# Patient Record
Sex: Female | Born: 1995 | Race: Black or African American | Hispanic: No | Marital: Single | Smoking: Never smoker
Health system: Southern US, Community
[De-identification: ages and names within clinical notes are randomized; demographics above are authoritative.]

## PROBLEM LIST (undated history)

## (undated) DIAGNOSIS — B009 Herpesviral infection, unspecified: Secondary | ICD-10-CM

## (undated) HISTORY — DX: Herpesviral infection, unspecified: B00.9

---
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
## 2015-08-26 ENCOUNTER — Encounter: Payer: Self-pay | Admitting: Obstetrics

## 2015-08-26 ENCOUNTER — Ambulatory Visit (INDEPENDENT_AMBULATORY_CARE_PROVIDER_SITE_OTHER): Payer: 59 | Admitting: Obstetrics

## 2015-08-26 VITALS — BP 113/69 | HR 73 | Temp 98.7°F | Wt 140.0 lb

## 2015-08-26 DIAGNOSIS — B373 Candidiasis of vulva and vagina: Secondary | ICD-10-CM

## 2015-08-26 DIAGNOSIS — N760 Acute vaginitis: Secondary | ICD-10-CM | POA: Diagnosis not present

## 2015-08-26 DIAGNOSIS — N898 Other specified noninflammatory disorders of vagina: Secondary | ICD-10-CM | POA: Diagnosis not present

## 2015-08-26 DIAGNOSIS — A499 Bacterial infection, unspecified: Secondary | ICD-10-CM

## 2015-08-26 DIAGNOSIS — B9689 Other specified bacterial agents as the cause of diseases classified elsewhere: Secondary | ICD-10-CM

## 2015-08-26 DIAGNOSIS — B3731 Acute candidiasis of vulva and vagina: Secondary | ICD-10-CM

## 2015-08-26 MED ORDER — FLUCONAZOLE 150 MG PO TABS: 150.0000 mg | ORAL_TABLET | Freq: Once | ORAL | Status: DC

## 2015-08-26 MED ORDER — METRONIDAZOLE 0.75 % VA GEL: 1.0000 | Freq: Two times a day (BID) | VAGINAL | Status: DC

## 2015-08-26 NOTE — Progress Notes (Signed)
Patient ID: Cassie PennerLauren M Church, female   DOB: 11-Apr-1996, 19 y.o.   MRN: [id number]  Chief Complaint  Patient presents with  . Vaginal Discharge    HPI Cassie Church is a 19 y.o. female.  Malodorous vaginal discharge - fishy odor.  Denies itching. HPI  History reviewed. No pertinent past medical history.  Past Surgical History  Procedure Laterality Date  . Wisdom tooth extraction Bilateral 2014    Family History  Problem Relation Age of Onset  . Hyperlipidemia Father   . Hypertension Father   . Diabetes Paternal Grandfather     Social History Social History  Substance Use Topics  . Smoking status: Never Smoker   . Smokeless tobacco: None  . Alcohol Use: No    No Known Allergies  Current Outpatient Prescriptions  Medication Sig Dispense Refill  . fluconazole (DIFLUCAN) 150 MG tablet Take 1 tablet (150 mg total) by mouth once. 1 tablet 2  . metroNIDAZOLE (METROGEL VAGINAL) 0.75 % vaginal gel Place 1 Applicatorful vaginally 2 (two) times daily. 70 g 2   No current facility-administered medications for this visit.    Review of Systems Review of Systems Constitutional: negative for fatigue and weight loss Respiratory: negative for cough and wheezing Cardiovascular: negative for chest pain, fatigue and palpitations Gastrointestinal: negative for abdominal pain and change in bowel habits Genitourinary: positive for malodorous vaginal discharge Integument/breast: negative for nipple discharge Musculoskeletal:negative for myalgias Neurological: negative for gait problems and tremors Behavioral/Psych: negative for abusive relationship, depression Endocrine: negative for temperature intolerance     Blood pressure 113/69, pulse 73, temperature 98.7 F (37.1 C), weight 140 lb (63.504 kg), last menstrual period 07/19/2015.  Physical Exam Physical Exam            General:  Alert and no distress Abdomen:  normal findings: no organomegaly, soft, non-tender and no hernia   Pelvis:  External genitalia: normal general appearance Urinary system: urethral meatus normal and bladder without fullness, nontender Vaginal: normal without tenderness, induration or masses Cervix: normal appearance Adnexa: normal bimanual exam Uterus: anteverted and non-tender, normal size      Data Reviewed Labs  Assessment     BV     Plan    MetroGel Rx F/U prn  Orders Placed This Encounter  Procedures  . SureSwab, Vaginosis/Vaginitis Plus   Meds ordered this encounter  Medications  . fluconazole (DIFLUCAN) 150 MG tablet    Sig: Take 1 tablet (150 mg total) by mouth once.    Dispense:  1 tablet    Refill:  2  . metroNIDAZOLE (METROGEL VAGINAL) 0.75 % vaginal gel    Sig: Place 1 Applicatorful vaginally 2 (two) times daily.    Dispense:  70 g    Refill:  2

## 2015-08-30 ENCOUNTER — Other Ambulatory Visit: Payer: Self-pay | Admitting: Obstetrics

## 2015-08-30 DIAGNOSIS — A5609 Other chlamydial infection of lower genitourinary tract: Secondary | ICD-10-CM

## 2015-08-30 LAB — SURESWAB, VAGINOSIS/VAGINITIS PLUS
Atopobium vaginae: NOT DETECTED Log (cells/mL)
C. GLABRATA, DNA: NOT DETECTED
C. TRACHOMATIS RNA, TMA: DETECTED — AB
C. TROPICALIS, DNA: NOT DETECTED
C. albicans, DNA: NOT DETECTED
C. parapsilosis, DNA: NOT DETECTED
LACTOBACILLUS SPECIES: 8 Log (cells/mL)
MEGASPHAERA SPECIES: NOT DETECTED Log (cells/mL)
N. gonorrhoeae RNA, TMA: NOT DETECTED
T. vaginalis RNA, QL TMA: NOT DETECTED

## 2015-08-30 MED ORDER — CEFIXIME 400 MG PO TABS: 400.0000 mg | ORAL_TABLET | Freq: Every day | ORAL | Status: DC

## 2015-08-30 MED ORDER — AZITHROMYCIN 250 MG PO TABS: 1000.0000 mg | ORAL_TABLET | Freq: Once | ORAL | Status: DC

## 2015-11-03 ENCOUNTER — Ambulatory Visit: Payer: Self-pay | Admitting: Obstetrics

## 2016-09-14 ENCOUNTER — Ambulatory Visit (INDEPENDENT_AMBULATORY_CARE_PROVIDER_SITE_OTHER): Payer: 59 | Admitting: Obstetrics

## 2016-09-14 ENCOUNTER — Encounter: Payer: Self-pay | Admitting: Obstetrics

## 2016-09-14 VITALS — BP 119/67 | HR 74 | Wt 138.0 lb

## 2016-09-14 DIAGNOSIS — N760 Acute vaginitis: Secondary | ICD-10-CM | POA: Diagnosis not present

## 2016-09-14 DIAGNOSIS — N898 Other specified noninflammatory disorders of vagina: Secondary | ICD-10-CM | POA: Diagnosis not present

## 2016-09-14 DIAGNOSIS — B9689 Other specified bacterial agents as the cause of diseases classified elsewhere: Secondary | ICD-10-CM

## 2016-09-14 MED ORDER — METRONIDAZOLE 0.75 % VA GEL: 1.0000 | Freq: Two times a day (BID) | VAGINAL | 2 refills | Status: DC

## 2016-09-14 NOTE — Addendum Note (Signed)
Addended by: Elby BeckPAUL, JANE F on: 09/14/2016 02:09 PM   Modules accepted: Orders

## 2016-09-14 NOTE — Progress Notes (Signed)
Patient ID: Cassie Church, female   DOB: 1995/12/05, 20 y.o.   MRN: [id number]  Chief Complaint  Patient presents with  . Gynecologic Exam    Patient is in the office- past 2 weeks she has had odor. Patient used gel- she wants to get checked.    HPI Cassie PennerLauren M Church is a 21 y.o. female.  Malodorous vaginal discharge. HPI  History reviewed. No pertinent past medical history.  Past Surgical History:  Procedure Laterality Date  . WISDOM TOOTH EXTRACTION Bilateral 2014    Family History  Problem Relation Age of Onset  . Hyperlipidemia Father   . Hypertension Father   . Diabetes Paternal Grandfather     Social History Social History  Substance Use Topics  . Smoking status: Never Smoker  . Smokeless tobacco: Never Used  . Alcohol use No    No Known Allergies  Current Outpatient Prescriptions  Medication Sig Dispense Refill  . azithromycin (ZITHROMAX) 250 MG tablet Take 4 tablets (1,000 mg total) by mouth once. 4 tablet 0  . cefixime (SUPRAX) 400 MG tablet Take 1 tablet (400 mg total) by mouth daily. 1 tablet 0  . fluconazole (DIFLUCAN) 150 MG tablet Take 1 tablet (150 mg total) by mouth once. 1 tablet 2  . metroNIDAZOLE (METROGEL VAGINAL) 0.75 % vaginal gel Place 1 Applicatorful vaginally 2 (two) times daily. 70 g 2   No current facility-administered medications for this visit.     Review of Systems Review of Systems Constitutional: negative for fatigue and weight loss Respiratory: negative for cough and wheezing Cardiovascular: negative for chest pain, fatigue and palpitations Gastrointestinal: negative for abdominal pain and change in bowel habits Genitourinary:positive for malodorous vaginal discharge Integument/breast: negative for nipple discharge Musculoskeletal:negative for myalgias Neurological: negative for gait problems and tremors Behavioral/Psych: negative for abusive relationship, depression Endocrine: negative for temperature intolerance      Blood  pressure 119/67, pulse 74, weight 138 lb (62.6 kg), last menstrual period 08/03/2016.  Physical Exam Physical Exam           General:  Alert and no distress Abdomen:  normal findings: no organomegaly, soft, non-tender and no hernia  Pelvis:  External genitalia: normal general appearance Urinary system: urethral meatus normal and bladder without fullness, nontender Vaginal: normal without tenderness, induration or masses Cervix: normal appearance Adnexa: normal bimanual exam Uterus: anteverted and non-tender, normal size    50% of 15 min visit spent on counseling and coordination of care.    Data Reviewed Wet Prep  Assessment     BV, recurrent    Plan    Wet Prep ordered MetroGel Rx Follow up prn  No orders of the defined types were placed in this encounter.  No orders of the defined types were placed in this encounter.         Patient ID: Cassie Church, female   DOB: 1995/12/05, 20 y.o.   MRN: [id number]

## 2016-09-14 NOTE — Progress Notes (Signed)
Patient has prolonged bleeding with the Nexplanon- which may be causing chronic BV.

## 2016-09-16 ENCOUNTER — Other Ambulatory Visit: Payer: Self-pay | Admitting: Obstetrics

## 2016-09-16 LAB — NUSWAB VAGINITIS PLUS (VG+)
Atopobium vaginae: HIGH Score — AB
BVAB 2: HIGH Score — AB
Candida albicans, NAA: NEGATIVE
Candida glabrata, NAA: NEGATIVE
Chlamydia trachomatis, NAA: NEGATIVE
MEGASPHAERA 1: HIGH {score} — AB
Neisseria gonorrhoeae, NAA: NEGATIVE
Trich vag by NAA: NEGATIVE

## 2017-06-28 ENCOUNTER — Ambulatory Visit (INDEPENDENT_AMBULATORY_CARE_PROVIDER_SITE_OTHER): Payer: 59 | Admitting: Obstetrics

## 2017-06-28 ENCOUNTER — Encounter: Payer: Self-pay | Admitting: Obstetrics

## 2017-06-28 VITALS — BP 117/69 | HR 82 | Wt 131.8 lb

## 2017-06-28 DIAGNOSIS — Z3046 Encounter for surveillance of implantable subdermal contraceptive: Secondary | ICD-10-CM

## 2017-06-28 DIAGNOSIS — Z3202 Encounter for pregnancy test, result negative: Secondary | ICD-10-CM

## 2017-06-28 LAB — POCT URINE PREGNANCY: Preg Test, Ur: NEGATIVE

## 2017-06-28 MED ORDER — ETONOGESTREL 68 MG ~~LOC~~ IMPL
68.0000 mg | DRUG_IMPLANT | Freq: Once | SUBCUTANEOUS | Status: AC
  Administered 2017-06-28: 68 mg via SUBCUTANEOUS

## 2017-06-28 NOTE — Progress Notes (Signed)
Nexplanon Procedure Note    PROCEDURE: Nexplanon removal and placement Performing Provider: Brock Bad MD  Patient education prior to procedure, explained risk, benefits of Nexplanon, reviewed alternative options. Patient reported understanding. Gave consent to continue with procedure.  Patient had Nexplanon inserted in 2015. Desires removal today w/ reinsertion  PROCEDURE:  Pregnancy Text :  negative Site (check):      left arm         Sterile Preparation:   Betadinex3 Lot # [id number] Expiration Date 01 / 2021    The patient's left arm was palpated and the implant device located. The area was prepped with Betadinex3. The distal end of the device was palpated and 1.5 cc of 1% lidocaine without epinephrine was injected. A 1.5 mm incision was made. Any fibrotic tissue was carefully dissected away using blunt and/or sharp dissection. The device was removed in an intact manner.   Insertion site was the same as the removal site. Nexplanon  was inserted subcutaneously.Needle was removed from the insertion site. Nexplanon capsule was palpated by provider and patient to assure satisfactory placement. Dressing applied.  Followup: The patient tolerated the procedure well without complications.  Standard post-procedure care is explained and return precautions are given.  Brock Bad MD

## 2017-07-07 ENCOUNTER — Encounter: Payer: Self-pay | Admitting: *Deleted

## 2017-07-12 ENCOUNTER — Encounter: Payer: Self-pay | Admitting: Obstetrics

## 2017-07-12 ENCOUNTER — Ambulatory Visit (INDEPENDENT_AMBULATORY_CARE_PROVIDER_SITE_OTHER): Payer: 59 | Admitting: Obstetrics

## 2017-07-12 VITALS — BP 117/77 | HR 73 | Ht 66.0 in | Wt 131.0 lb

## 2017-07-12 DIAGNOSIS — Z3046 Encounter for surveillance of implantable subdermal contraceptive: Secondary | ICD-10-CM | POA: Diagnosis not present

## 2017-07-12 NOTE — Progress Notes (Signed)
Patient is in the office for nexplanon follow up, device placed on 06-28-17.

## 2017-07-12 NOTE — Progress Notes (Signed)
Subjective:    Cassie Church is a 21 y.o. female who presents for contraception counseling. The patient has no complaints today. The patient is sexually active. Pertinent past medical history: none.  The information documented in the HPI was reviewed and verified.  Menstrual History: OB History    Gravida Para Term Preterm AB Living   0 0 0 0 0 0   SAB TAB Ectopic Multiple Live Births   0 0 0 0         No LMP recorded. Patient has had an implant.   There are no active problems to display for this patient.  History reviewed. No pertinent past medical history.  Past Surgical History:  Procedure Laterality Date  . WISDOM TOOTH EXTRACTION Bilateral 2014     Current Outpatient Prescriptions:  .  etonogestrel (IMPLANON) 68 MG IMPL implant, Inject into the skin., Disp: , Rfl:  No Known Allergies  Social History  Substance Use Topics  . Smoking status: Never Smoker  . Smokeless tobacco: Never Used  . Alcohol use No    Family History  Problem Relation Age of Onset  . Hyperlipidemia Father   . Hypertension Father   . Diabetes Paternal Grandfather        Review of Systems Constitutional: negative for weight loss Genitourinary:negative for abnormal menstrual periods and vaginal discharge   Objective:   BP 117/77   Pulse 73   Ht 5\' 6"  (1.676 m)   Wt 131 lb (59.4 kg)   BMI 21.14 kg/m    PE:          General:  Alert and no distress          Lungs:  Clear          Heart:  RRR          Right upper extremity:  Nexplanon removal site is C, D, intact, and non tender            Lab Review Urine pregnancy test Labs reviewed yes Radiologic studies reviewed no  50% of 15 min visit spent on counseling and coordination of care.    Assessment:    21 y.o., continuing Nexplanon, no contraindications.   Plan:    All questions answered. Contraception: Nexplanon. Discussed healthy lifestyle modifications. Follow up in 6 weeks.  No orders of the defined types were  placed in this encounter.  No orders of the defined types were placed in this encounter.

## 2017-08-23 ENCOUNTER — Ambulatory Visit: Payer: 59 | Admitting: Obstetrics

## 2017-09-14 ENCOUNTER — Ambulatory Visit: Payer: 59 | Admitting: Obstetrics

## 2017-09-27 ENCOUNTER — Encounter: Payer: Self-pay | Admitting: Obstetrics

## 2017-09-27 ENCOUNTER — Ambulatory Visit (INDEPENDENT_AMBULATORY_CARE_PROVIDER_SITE_OTHER): Payer: 59 | Admitting: Obstetrics

## 2017-09-27 VITALS — BP 120/72 | HR 65 | Ht 65.0 in | Wt 135.0 lb

## 2017-09-27 DIAGNOSIS — Z3046 Encounter for surveillance of implantable subdermal contraceptive: Secondary | ICD-10-CM

## 2017-09-27 DIAGNOSIS — Z01419 Encounter for gynecological examination (general) (routine) without abnormal findings: Secondary | ICD-10-CM

## 2017-09-27 DIAGNOSIS — Z113 Encounter for screening for infections with a predominantly sexual mode of transmission: Secondary | ICD-10-CM | POA: Diagnosis not present

## 2017-09-27 DIAGNOSIS — N898 Other specified noninflammatory disorders of vagina: Secondary | ICD-10-CM

## 2017-09-27 DIAGNOSIS — Z124 Encounter for screening for malignant neoplasm of cervix: Secondary | ICD-10-CM

## 2017-09-27 NOTE — Progress Notes (Signed)
Subjective:        Cassie PennerLauren M Church is a 22 y.o. female here for a routine exam.  Current complaints: None.    Personal health questionnaire:  Is patient Cassie Church, have a family history of breast and/or ovarian cancer: no Is there a family history of uterine cancer diagnosed at age < 4550, gastrointestinal cancer, urinary tract cancer, family member who is a Personnel officerLynch syndrome-associated carrier: no Is the patient overweight and hypertensive, family history of diabetes, personal history of gestational diabetes, preeclampsia or PCOS: no Is patient over 755, have PCOS,  family history of premature CHD under age 22, diabetes, smoke, have hypertension or peripheral artery disease:  no At any time, has a partner hit, kicked or otherwise hurt or frightened you?: no Over the past 2 weeks, have you felt down, depressed or hopeless?: no Over the past 2 weeks, have you felt little interest or pleasure in doing things?:no   Gynecologic History Patient's last menstrual period was 08/22/2017. Contraception: Nexplanon Last Pap: n/a. Results were: n/a Last mammogram: n/a. Results were: n/a  Obstetric History OB History  Gravida Para Term Preterm AB Living  0 0 0 0 0 0  SAB TAB Ectopic Multiple Live Births  0 0 0 0          History reviewed. No pertinent past medical history.  Past Surgical History:  Procedure Laterality Date  . WISDOM TOOTH EXTRACTION Bilateral 2014     Current Outpatient Medications:  .  etonogestrel (IMPLANON) 68 MG IMPL implant, Inject into the skin., Disp: , Rfl:  No Known Allergies  Social History   Tobacco Use  . Smoking status: Never Smoker  . Smokeless tobacco: Never Used  Substance Use Topics  . Alcohol use: No    Family History  Problem Relation Age of Onset  . Hyperlipidemia Father   . Hypertension Father   . Diabetes Paternal Grandfather       Review of Systems  Constitutional: negative for fatigue and weight loss Respiratory: negative for  cough and wheezing Cardiovascular: negative for chest pain, fatigue and palpitations Gastrointestinal: negative for abdominal pain and change in bowel habits Musculoskeletal:negative for myalgias Neurological: negative for gait problems and tremors Behavioral/Psych: negative for abusive relationship, depression Endocrine: negative for temperature intolerance    Genitourinary:negative for abnormal menstrual periods, genital lesions, hot flashes, sexual problems and vaginal discharge Integument/breast: negative for breast lump, breast tenderness, nipple discharge and skin lesion(s)    Objective:       BP 120/72   Pulse 65   Ht 5\' 5"  (1.651 m)   Wt 135 lb (61.2 kg)   LMP 08/22/2017   BMI 22.47 kg/m  General:   alert  Skin:   no rash or abnormalities  Lungs:   clear to auscultation bilaterally  Heart:   regular rate and rhythm, S1, S2 normal, no murmur, click, rub or gallop  Breasts:   normal without suspicious masses, skin or nipple changes or axillary nodes  Abdomen:  normal findings: no organomegaly, soft, non-tender and no hernia  Pelvis:  External genitalia: normal general appearance Urinary system: urethral meatus normal and bladder without fullness, nontender Vaginal: normal without tenderness, induration or masses Cervix: normal appearance Adnexa: normal bimanual exam Uterus: anteverted and non-tender, normal size   Lab Review Urine pregnancy test Labs reviewed yes Radiologic studies reviewed no  50% of 20 min visit spent on counseling and coordination of care.   Assessment:     1. Encounter for gynecological  examination with Papanicolaou smear of cervix Rx: - Cytology - PAP  2. Vaginal discharge Rx: - Cervicovaginal ancillary only  3. Screen for STD (sexually transmitted disease) Rx: - Hepatitis C antibody - Hepatitis B surface antigen - HIV antibody - RPR  4. Encounter for surveillance of implantable subdermal contraceptive - pleased with Nexplanon    Plan:    Education reviewed: calcium supplements, depression evaluation, low fat, low cholesterol diet, safe sex/STD prevention, self breast exams and weight bearing exercise. Contraception: Nexplanon. Follow up in: 1 year.   No orders of the defined types were placed in this encounter.  Orders Placed This Encounter  Procedures  . Hepatitis C antibody  . Hepatitis B surface antigen  . HIV antibody  . RPR

## 2017-09-28 LAB — CYTOLOGY - PAP: Diagnosis: NEGATIVE

## 2017-09-28 LAB — CERVICOVAGINAL ANCILLARY ONLY
Bacterial vaginitis: NEGATIVE
CHLAMYDIA, DNA PROBE: NEGATIVE
Candida vaginitis: NEGATIVE
NEISSERIA GONORRHEA: NEGATIVE
Trichomonas: NEGATIVE

## 2017-09-28 LAB — RPR: RPR Ser Ql: NONREACTIVE

## 2017-09-28 LAB — HEPATITIS C ANTIBODY: Hep C Virus Ab: <0.1 s/co ratio (ref 0.0–0.9)

## 2017-09-28 LAB — HEPATITIS B SURFACE ANTIGEN: HEP B S AG: NEGATIVE

## 2017-09-28 LAB — HIV ANTIBODY (ROUTINE TESTING W REFLEX): HIV Screen 4th Generation wRfx: NONREACTIVE

## 2017-10-19 ENCOUNTER — Telehealth: Payer: Self-pay

## 2017-10-19 NOTE — Telephone Encounter (Signed)
TC to pt regarding her message/concerns about results.  Pt was not ava at the time left detailed message for pt to call .

## 2017-10-19 NOTE — Telephone Encounter (Signed)
Pt made aware of results. Pt voiced understanding.  

## 2019-06-14 ENCOUNTER — Other Ambulatory Visit: Payer: Self-pay

## 2019-06-14 ENCOUNTER — Encounter: Payer: Self-pay | Admitting: Certified Nurse Midwife

## 2019-06-14 ENCOUNTER — Ambulatory Visit (INDEPENDENT_AMBULATORY_CARE_PROVIDER_SITE_OTHER): Payer: 59 | Admitting: Certified Nurse Midwife

## 2019-06-14 VITALS — BP 114/74 | HR 67 | Ht 66.0 in | Wt 138.0 lb

## 2019-06-14 DIAGNOSIS — Z113 Encounter for screening for infections with a predominantly sexual mode of transmission: Secondary | ICD-10-CM

## 2019-06-14 DIAGNOSIS — Z01419 Encounter for gynecological examination (general) (routine) without abnormal findings: Secondary | ICD-10-CM | POA: Diagnosis not present

## 2019-06-14 DIAGNOSIS — Z3046 Encounter for surveillance of implantable subdermal contraceptive: Secondary | ICD-10-CM

## 2019-06-14 NOTE — Progress Notes (Signed)
GYNECOLOGY ANNUAL PREVENTATIVE CARE ENCOUNTER NOTE  History:     Cassie Church is a 23 y.o. G0P0000 female here for a routine annual gynecologic exam.  Current complaints: request STD screening and removal of Nexplanon. Denies discharge, pelvic pain, problems with intercourse or other gynecologic concerns. Patient reports irregular cycles with Nexplanon- was reinserted in 2018, plans to use condoms for birth control.    Gynecologic History Patient's last menstrual period was 05/27/2019. Contraception: Nexplanon- wants removed today  Last Pap: 09/2017. Results were: normal with negative HPV  Obstetric History OB History  Gravida Para Term Preterm AB Living  0 0 0 0 0 0  SAB TAB Ectopic Multiple Live Births  0 0 0 0      Past Medical History:  Diagnosis Date  . Herpes simplex virus (HSV) infection     Past Surgical History:  Procedure Laterality Date  . WISDOM TOOTH EXTRACTION Bilateral 2014    Current Outpatient Medications on File Prior to Visit  Medication Sig Dispense Refill  . etonogestrel (IMPLANON) 68 MG IMPL implant Inject into the skin.     No current facility-administered medications on file prior to visit.     No Known Allergies  Social History:  reports that she has never smoked. She has never used smokeless tobacco. She reports that she does not drink alcohol or use drugs.  Family History  Problem Relation Age of Onset  . Hyperlipidemia Father   . Hypertension Father   . Diabetes Paternal Grandfather     The following portions of the patient's history were reviewed and updated as appropriate: allergies, current medications, past family history, past medical history, past social history, past surgical history and problem list.  Review of Systems Pertinent items noted in HPI and remainder of comprehensive ROS otherwise negative.  Physical Exam:  BP 114/74   Pulse 67   Ht 5\' 6"  (1.676 m)   Wt 138 lb (62.6 kg)   LMP 05/27/2019   BMI 22.27 kg/m   CONSTITUTIONAL: Well-developed, well-nourished female in no acute distress.  HENT:  Normocephalic, atraumatic, External right and left ear normal. Oropharynx is clear and moist EYES: Conjunctivae and EOM are normal. Pupils are equal, round, and reactive to light.  NECK: Normal range of motion, supple, no masses.  Normal thyroid.  SKIN: Skin is warm and dry. No rash noted. Not diaphoretic. No erythema. No pallor. MUSCULOSKELETAL: Normal range of motion. No tenderness.  No cyanosis, clubbing, or edema.  2+ distal pulses. NEUROLOGIC: Alert and oriented to person, place, and time. Normal reflexes, muscle tone coordination. No cranial nerve deficit noted. PSYCHIATRIC: Normal mood and affect. Normal behavior. Normal judgment and thought content. CARDIOVASCULAR: Normal heart rate noted, regular rhythm RESPIRATORY: Clear to auscultation bilaterally. Effort and breath sounds normal, no problems with respiration noted. BREASTS: Symmetric in size. No masses, skin changes, nipple drainage, or lymphadenopathy. ABDOMEN: Soft, normal bowel sounds, no distention noted.  No tenderness, rebound or guarding.   PROCEDURE: GYNECOLOGY CLINIC PROCEDURE NOTE Nexplanon Removal Patient was given informed consent for removal of her Nexplanon.  Appropriate time out taken. Nexplanon site identified.  Area prepped in usual sterile fashon. One ml of 1% lidocaine was used to anesthetize the area at the distal end of the implant. A small stab incision was made right beside the implant on the distal portion.  The Nexplanon rod was grasped using hemostats and removed without difficulty.  There was minimal blood loss. There were no complications. Steri-strips were applied over  the small incision.  A pressure bandage was applied to reduce any bruising.  The patient tolerated the procedure well and was given post procedure instructions.  Patient is planning to use condoms for contraception. Patient wants a "break" from birth  control/hormonal birth control.    Assessment and Plan:    1. Women's annual routine gynecological examination - Normal well woman examination   2. Screening for STDs (sexually transmitted diseases) - Patient request STD screening  - Cervicovaginal ancillary only( [place]) - HIV Antibody (routine testing w rflx) - Hepatitis B surface antigen - RPR - Hepatitis C antibody  3. Encounter for Nexplanon removal - See above procedure note for nexplanon removal   Will follow up results of STD screening and manage accordingly. Routine preventative health maintenance measures emphasized. Please refer to After Visit Summary for other counseling recommendations.      Sharyon Cable, CNM Center for [employer], [facility]

## 2019-06-14 NOTE — Patient Instructions (Signed)

## 2019-06-15 LAB — CERVICOVAGINAL ANCILLARY ONLY
Chlamydia: NEGATIVE
Neisseria Gonorrhea: NEGATIVE
Trichomonas: NEGATIVE

## 2019-06-15 LAB — HEPATITIS B SURFACE ANTIGEN: Hepatitis B Surface Ag: NEGATIVE

## 2019-06-15 LAB — HIV ANTIBODY (ROUTINE TESTING W REFLEX): HIV Screen 4th Generation wRfx: NONREACTIVE

## 2019-06-15 LAB — HEPATITIS C ANTIBODY: Hep C Virus Ab: <0.1 s/co ratio (ref 0.0–0.9)

## 2019-06-15 LAB — RPR: RPR Ser Ql: NONREACTIVE

## 2020-01-07 ENCOUNTER — Ambulatory Visit (INDEPENDENT_AMBULATORY_CARE_PROVIDER_SITE_OTHER): Payer: 59 | Admitting: Obstetrics

## 2020-01-07 ENCOUNTER — Other Ambulatory Visit (HOSPITAL_COMMUNITY)
Admission: RE | Admit: 2020-01-07 | Discharge: 2020-01-07 | Disposition: A | Discharge status: Home / Self Care | Payer: 59 | Source: Ambulatory Visit | Attending: Obstetrics | Admitting: Obstetrics

## 2020-01-07 ENCOUNTER — Other Ambulatory Visit: Payer: Self-pay

## 2020-01-07 ENCOUNTER — Encounter: Payer: Self-pay | Admitting: Obstetrics

## 2020-01-07 VITALS — BP 127/81 | HR 75 | Wt 153.0 lb

## 2020-01-07 DIAGNOSIS — N760 Acute vaginitis: Secondary | ICD-10-CM | POA: Insufficient documentation

## 2020-01-07 DIAGNOSIS — R102 Pelvic and perineal pain: Secondary | ICD-10-CM

## 2020-01-07 DIAGNOSIS — N898 Other specified noninflammatory disorders of vagina: Secondary | ICD-10-CM

## 2020-01-07 DIAGNOSIS — B9689 Other specified bacterial agents as the cause of diseases classified elsewhere: Secondary | ICD-10-CM | POA: Diagnosis present

## 2020-01-07 DIAGNOSIS — N9412 Deep dyspareunia: Secondary | ICD-10-CM

## 2020-01-07 MED ORDER — NUVESSA 1.3 % VA GEL: 1.0000 | Freq: Once | VAGINAL | 6 refills | Status: AC

## 2020-01-07 NOTE — Progress Notes (Signed)
Patient ID: Cassie Church, female   DOB: Apr 05, 1996, 24 y.o.   MRN: [id number]  Chief Complaint  Patient presents with  . Vaginitis  . Dyspareunia    HPI Cassie Church is a 24 y.o. female.  Complains of pelvic pain with intercourse.  The pain is described as suprapubic and is only associated with intercourse.  Denies dysuria, abnormal vaginal bleeding or discharge.  She does have chronic BV that usually occurs after her periods.  Recently had Nexplanon removed HPI  Past Medical History:  Diagnosis Date  . Herpes simplex virus (HSV) infection     Past Surgical History:  Procedure Laterality Date  . WISDOM TOOTH EXTRACTION Bilateral 2014    Family History  Problem Relation Age of Onset  . Hyperlipidemia Father   . Hypertension Father   . Diabetes Paternal Grandfather     Social History Social History   Tobacco Use  . Smoking status: Never Smoker  . Smokeless tobacco: Never Used  Substance Use Topics  . Alcohol use: Yes    Comment: socially   . Drug use: No    No Known Allergies  Current Outpatient Medications  Medication Sig Dispense Refill  . etonogestrel (IMPLANON) 68 MG IMPL implant Inject into the skin.    Marland Kitchen metroNIDAZOLE (NUVESSA) 1.3 % GEL Place 1 Applicatorful vaginally once for 1 dose. 5 g 6   No current facility-administered medications for this visit.    Review of Systems Review of Systems Constitutional: negative for fatigue and weight loss Respiratory: negative for cough and wheezing Cardiovascular: negative for chest pain, fatigue and palpitations Gastrointestinal: negative for abdominal pain and change in bowel habits Genitourinary:positive for pelvic pain after intercourse Integument/breast: negative for nipple discharge Musculoskeletal:negative for myalgias Neurological: negative for gait problems and tremors Behavioral/Psych: negative for abusive relationship, depression Endocrine: negative for temperature intolerance      Blood pressure  127/81, pulse 75, weight 153 lb (69.4 kg), last menstrual period 12/16/2019.  Physical Exam Physical Exam General:   alert  Skin:   no rash or abnormalities  Lungs:   clear to auscultation bilaterally  Heart:   regular rate and rhythm, S1, S2 normal, no murmur, click, rub or gallop  Breasts:   normal without suspicious masses, skin or nipple changes or axillary nodes  Abdomen:  normal findings: no organomegaly, soft, non-tender and no hernia  Pelvis:  External genitalia: normal general appearance Urinary system: urethral meatus normal and bladder without fullness, nontender Vaginal: normal without tenderness, induration or masses Cervix: normal appearance Adnexa: normal bimanual exam Uterus: anteverted and tender, normal size    50% of 15 min visit spent on counseling and coordination of care.   Data Reviewed Wet Prep  Assessment       1. Pelvic pain Rx: - US PELVIC COMPLETE WITH TRANSVAGINAL; Future  2. Deep dyspareunia  3. Vaginal discharge Rx: - Cervicovaginal ancillary only( [place])  4. BV (bacterial vaginosis) Rx: - metroNIDAZOLE (NUVESSA) 1.3 % GEL; Place 1 Applicatorful vaginally once for 1 dose.  Dispense: 5 g; Refill: 6  Plan   Follow up in 2 weeks  Orders Placed This Encounter  Procedures  . US PELVIC COMPLETE WITH TRANSVAGINAL    153 / uhc / no needs / fh w annetra / no to coivd q's  ORDER CHECKED 01/07/20/[facility]    Standing Status:   Future    Standing Expiration Date:   03/08/2021    Order Specific Question:   Reason for Exam (SYMPTOM  OR DIAGNOSIS REQUIRED)    Answer:   Pelvic pain    Order Specific Question:   Preferred imaging location?    Answer:   [id number] Cassie Church   Meds ordered this encounter  Medications  . metroNIDAZOLE (NUVESSA) 1.3 % GEL    Sig: Place 1 Applicatorful vaginally once for 1 dose.    Dispense:  5 g    Refill:  6     Brock Bad, MD 01/07/2020 10:48 AM

## 2020-01-07 NOTE — Progress Notes (Signed)
RGYN patient presents for problem visit today.  C/O: pt notes during/after  intercourse pt notes it has become uncomfortable pt  Pain describes as dull and pressure this past month.   Pt denies any irregular bleeding.   notes recurrent BV infections after cycle.   Pt notes using Boric Acid supp. sometime she will have relief and then at others she does not.

## 2020-01-08 LAB — CERVICOVAGINAL ANCILLARY ONLY
Bacterial Vaginitis (gardnerella): POSITIVE — AB
Candida Glabrata: NEGATIVE
Candida Vaginitis: NEGATIVE
Chlamydia: NEGATIVE
Comment: NEGATIVE
Comment: NEGATIVE
Comment: NEGATIVE
Comment: NEGATIVE
Comment: NEGATIVE
Comment: NORMAL
Neisseria Gonorrhea: NEGATIVE
Trichomonas: NEGATIVE

## 2020-01-09 ENCOUNTER — Other Ambulatory Visit: Payer: Self-pay | Admitting: Obstetrics

## 2020-01-09 DIAGNOSIS — N760 Acute vaginitis: Secondary | ICD-10-CM

## 2020-01-09 DIAGNOSIS — B9689 Other specified bacterial agents as the cause of diseases classified elsewhere: Secondary | ICD-10-CM

## 2020-01-09 MED ORDER — TINIDAZOLE 500 MG PO TABS: 1000.0000 mg | ORAL_TABLET | Freq: Every day | ORAL | 2 refills | Status: DC

## 2020-01-16 ENCOUNTER — Ambulatory Visit
Admission: RE | Admit: 2020-01-16 | Discharge: 2020-01-16 | Disposition: A | Discharge status: Home / Self Care | Payer: 59 | Source: Ambulatory Visit | Attending: Obstetrics | Admitting: Obstetrics

## 2020-01-16 DIAGNOSIS — R102 Pelvic and perineal pain: Secondary | ICD-10-CM

## 2020-01-30 ENCOUNTER — Encounter: Payer: Self-pay | Admitting: Obstetrics

## 2020-01-30 ENCOUNTER — Telehealth (INDEPENDENT_AMBULATORY_CARE_PROVIDER_SITE_OTHER): Payer: 59 | Admitting: Obstetrics

## 2020-01-30 DIAGNOSIS — N9412 Deep dyspareunia: Secondary | ICD-10-CM | POA: Diagnosis not present

## 2020-01-30 DIAGNOSIS — R102 Pelvic and perineal pain: Secondary | ICD-10-CM | POA: Diagnosis not present

## 2020-01-30 NOTE — Progress Notes (Signed)
GYNECOLOGY VIRTUAL VISIT ENCOUNTER NOTE  Provider location: Center for [facility] [place]   I connected with Cassie Church on 01/30/20 at  4:15 PM EDT by MyChart Video Encounter at home and verified that I am speaking with the correct person using two identifiers.   I discussed the limitations, risks, security and privacy concerns of performing an evaluation and management service virtually and the availability of in person appointments. I also discussed with the patient that there may be a patient responsible charge related to this service. The patient expressed understanding and agreed to proceed.   History:  Cassie Church is a 24 y.o. G0P0000 female being evaluated today for results of ultrasound done for pelvic pain and deep dyspareunia. She denies any abnormal vaginal discharge, bleeding, pelvic pain or other concerns.       Past Medical History:  Diagnosis Date  . Herpes simplex virus (HSV) infection    Past Surgical History:  Procedure Laterality Date  . WISDOM TOOTH EXTRACTION Bilateral 2014   The following portions of the patient's history were reviewed and updated as appropriate: allergies, current medications, past family history, past medical history, past social history, past surgical history and problem list.   Health Maintenance:  Normal pap and negative HRHPV in 2019.    Review of Systems:  Pertinent items noted in HPI and remainder of comprehensive ROS otherwise negative.  Physical Exam:   General:  Alert, oriented and cooperative. Patient appears to be in no acute distress.  Mental Status: Normal mood and affect. Normal behavior. Normal judgment and thought content.   Respiratory: Normal respiratory effort, no problems with respiration noted  Rest of physical exam deferred due to type of encounter  Labs and Imaging No results found for this or any previous visit (from the past 336 hour(s)). US PELVIC COMPLETE WITH TRANSVAGINAL  Result Date:  01/16/2020 CLINICAL DATA:  Pelvic pain for 2 weeks, LMP 01/13/2020 EXAM: TRANSABDOMINAL AND TRANSVAGINAL ULTRASOUND OF PELVIS TECHNIQUE: Both transabdominal and transvaginal ultrasound examinations of the pelvis were performed. Transabdominal technique was performed for global imaging of the pelvis including uterus, ovaries, adnexal regions, and pelvic cul-de-sac. It was necessary to proceed with endovaginal exam following the transabdominal exam to visualize the endometrium and LEFT ovary. COMPARISON:  None FINDINGS: Uterus Measurements: 6.8 x 3.6 x 4.1 cm = volume: 53 mL. Anteverted. Normal morphology without mass Endometrium Thickness: 6 mm.  No endometrial fluid or focal mass Right ovary Measurements: 3.9 x 1.4 x 1.5 cm = volume: 4.2 mL. Normal morphology without mass Left ovary Measurements: At 4.2 x 2.5 x 1.7 cm = volume: 9.1 mL. Normal morphology without mass Other findings Trace free pelvic fluid, likely physiologic.  No adnexal masses. IMPRESSION: Normal exam. Electronically Signed   By: Ulyses Southward M.D.   On: 01/16/2020 12:07       Assessment and Plan:     1. Pelvic pain, resolving - ultrasound is normal  2. Deep dyspareunia - much less pain       I discussed the assessment and treatment plan with the patient. The patient was provided an opportunity to ask questions and all were answered. The patient agreed with the plan and demonstrated an understanding of the instructions.   The patient was advised to call back or seek an in-person evaluation/go to the ED if the symptoms worsen or if the condition fails to improve as anticipated.  I provided 10 minutes of face-to-face time during this encounter.  Baltazar Najjar, MD Center for [facility], [facility] 01/30/2020

## 2020-04-15 ENCOUNTER — Other Ambulatory Visit: Payer: Self-pay

## 2020-04-15 DIAGNOSIS — M7989 Other specified soft tissue disorders: Secondary | ICD-10-CM

## 2020-04-28 ENCOUNTER — Ambulatory Visit (HOSPITAL_COMMUNITY)
Admission: RE | Admit: 2020-04-28 | Discharge: 2020-04-28 | Disposition: A | Discharge status: Home / Self Care | Payer: 59 | Source: Ambulatory Visit | Attending: Surgery | Admitting: Surgery

## 2020-04-28 ENCOUNTER — Ambulatory Visit (INDEPENDENT_AMBULATORY_CARE_PROVIDER_SITE_OTHER): Payer: 59 | Admitting: Surgery

## 2020-04-28 ENCOUNTER — Other Ambulatory Visit: Payer: Self-pay

## 2020-04-28 ENCOUNTER — Encounter: Payer: Self-pay | Admitting: Surgery

## 2020-04-28 VITALS — BP 117/79 | HR 63 | Temp 97.8°F | Resp 20 | Ht 66.0 in | Wt 160.0 lb

## 2020-04-28 DIAGNOSIS — M7989 Other specified soft tissue disorders: Secondary | ICD-10-CM | POA: Insufficient documentation

## 2020-04-28 DIAGNOSIS — I890 Lymphedema, not elsewhere classified: Secondary | ICD-10-CM | POA: Diagnosis not present

## 2020-04-28 NOTE — Progress Notes (Signed)
Vascular and Vein Specialist of [facility]  Patient name: Cassie Church MRN: [id number] DOB: 03/06/1996 Sex: female   REQUESTING PROVIDER:    Rodolph Bong   REASON FOR CONSULT:    Varicose veins  HISTORY OF PRESENT ILLNESS:   Cassie Church is a 24 y.o. female, who is referred for left leg swelling.  She states that this began approximately 5 years ago.  It all goes back to when she had a stress fracture in her metatarsal bone on the left.  About a year after the fracture she began having swelling in her leg and foot at night.  2 years after that she started having swelling all the time.  Initially it would go down with walking however it is now worse with activity.  She also describes a tingling sensation that is concentrated in the midfoot.  She denies any history of DVT.  She does not wear compression stockings.  PAST MEDICAL HISTORY    Past Medical History:  Diagnosis Date  . Herpes simplex virus (HSV) infection      FAMILY HISTORY   Family History  Problem Relation Age of Onset  . Hyperlipidemia Father   . Hypertension Father   . Diabetes Paternal Grandfather     SOCIAL HISTORY:   Social History   Socioeconomic History  . Marital status: Single    Spouse name: Not on file  . Number of children: Not on file  . Years of education: Not on file  . Highest education level: Not on file  Occupational History  . Not on file  Tobacco Use  . Smoking status: Never Smoker  . Smokeless tobacco: Never Used  Vaping Use  . Vaping Use: Never used  Substance and Sexual Activity  . Alcohol use: Yes    Comment: socially   . Drug use: No  . Sexual activity: Yes    Partners: Male    Birth control/protection: Implant, None  Other Topics Concern  . Not on file  Social History Narrative  . Not on file   Social Determinants of Health   Financial Resource Strain:   . Difficulty of Paying Living Expenses:   Food Insecurity:   . Worried  About Programme researcher, broadcasting/film/video in the Last Year:   . Barista in the Last Year:   Transportation Needs:   . Freight forwarder (Medical):   Marland Kitchen Lack of Transportation (Non-Medical):   Physical Activity:   . Days of Exercise per Week:   . Minutes of Exercise per Session:   Stress:   . Feeling of Stress :   Social Connections:   . Frequency of Communication with Friends and Family:   . Frequency of Social Gatherings with Friends and Family:   . Attends Religious Services:   . Active Member of Clubs or Organizations:   . Attends Banker Meetings:   Marland Kitchen Marital Status:   Intimate Partner Violence:   . Fear of Current or Ex-Partner:   . Emotionally Abused:   Marland Kitchen Physically Abused:   . Sexually Abused:     ALLERGIES:    No Known Allergies  CURRENT MEDICATIONS:    No current outpatient medications on file.   No current facility-administered medications for this visit.    REVIEW OF SYSTEMS:   [X]  denotes positive finding, [ ]  denotes negative finding Cardiac  Comments:  Chest pain or chest pressure:    Shortness of breath upon exertion:    Short of  breath when lying flat:    Irregular heart rhythm:        Vascular    Pain in calf, thigh, or hip brought on by ambulation:    Pain in feet at night that wakes you up from your sleep:     Blood clot in your veins:    Leg swelling:  x       Pulmonary    Oxygen at home:    Productive cough:     Wheezing:         Neurologic    Sudden weakness in arms or legs:     Sudden numbness in arms or legs:     Sudden onset of difficulty speaking or slurred speech:    Temporary loss of vision in one eye:     Problems with dizziness:         Gastrointestinal    Blood in stool:      Vomited blood:         Genitourinary    Burning when urinating:     Blood in urine:        Psychiatric    Major depression:         Hematologic    Bleeding problems:    Problems with blood clotting too easily:        Skin      Rashes or ulcers:        Constitutional    Fever or chills:     PHYSICAL EXAM:   There were no vitals filed for this visit.  GENERAL: The patient is a well-nourished female, in no acute distress. The vital signs are documented above. CARDIAC: There is a regular rate and rhythm.  VASCULAR: Trace pitting edema up to the knee on the left.  Palpable pedal pulses PULMONARY: Nonlabored respirations MUSCULOSKELETAL: There are no major deformities or cyanosis. NEUROLOGIC: No focal weakness or paresthesias are detected. SKIN: There are no ulcers or rashes noted. PSYCHIATRIC: The patient has a normal affect.  STUDIES:   I have reviewed the following: Venous Reflux Times  +-----+---------+------+-----------+------------+--------+  RIGHTReflux NoRefluxReflux TimeDiameter cmsComments           Yes                   +-----+---------+------+-----------+------------+--------+  CFV no                         +-----+---------+------+-----------+------------+--------+     +--------------+---------+------+-----------+------------+--------+  LEFT     Reflux NoRefluxReflux TimeDiameter cmsComments               Yes                   +--------------+---------+------+-----------+------------+--------+  CFV      no                         +--------------+---------+------+-----------+------------+--------+  FV prox    no                         +--------------+---------+------+-----------+------------+--------+  FV mid    no                         +--------------+---------+------+-----------+------------+--------+  FV dist    no                         +--------------+---------+------+-----------+------------+--------+  Popliteal   no                          +--------------+---------+------+-----------+------------+--------+  GSV at [facility]  no               0.506        +--------------+---------+------+-----------+------------+--------+  GSV prox thighno               0.292        +--------------+---------+------+-----------+------------+--------+  GSV mid thigh no               0.292        +--------------+---------+------+-----------+------------+--------+  GSV dist thighno               0.280        +--------------+---------+------+-----------+------------+--------+  GSV at knee  no               0.335        +--------------+---------+------+-----------+------------+--------+  GSV prox calf                 0.247        +--------------+---------+------+-----------+------------+--------+  GSV mid calf                 0.214        +--------------+---------+------+-----------+------------+--------+  SSV Pop Fossa                 0.379        +--------------+---------+------+-----------+------------+--------+  SSV prox calf no               0.348        +--------------+---------+------+-----------+------------+--------+  SSV mid calf no               0.306        +--------------+---------+------+-----------+------------+--------+         Summary:  Left:  - No evidence of deep vein thrombosis seen in the left lower extremity,  from the common femoral through the popliteal veins.  - No evidence of superficial venous thrombosis in the left lower  extremity.  - There is no evidence of venous reflux seen in the left lower extremity.  - No evidence of superficial venous reflux seen in the left greater  saphenous vein.  - No evidence of superficial venous reflux seen  in the left short  saphenous vein.     ASSESSMENT and PLAN   Left leg swelling: Her venous reflux evaluation was normal today.  I think this represents lymphedema, likely triggered by her stress fracture.  I discussed the importance of compression stockings.  I am also referring her to lymphedema therapy.   Charlena Cross, MD, FACS Vascular and Vein Specialists of [facility] [phone] Pager [phone]

## 2020-05-28 ENCOUNTER — Ambulatory Visit (INDEPENDENT_AMBULATORY_CARE_PROVIDER_SITE_OTHER): Payer: 59

## 2020-05-28 ENCOUNTER — Other Ambulatory Visit: Payer: Self-pay

## 2020-05-28 DIAGNOSIS — N912 Amenorrhea, unspecified: Secondary | ICD-10-CM

## 2020-05-28 DIAGNOSIS — Z3490 Encounter for supervision of normal pregnancy, unspecified, unspecified trimester: Secondary | ICD-10-CM

## 2020-05-28 LAB — POCT URINE PREGNANCY: Preg Test, Ur: POSITIVE — AB

## 2020-05-28 MED ORDER — BLOOD PRESSURE MONITOR KIT: 1.0000 | PACK | 0 refills | Status: DC

## 2020-05-28 NOTE — Progress Notes (Signed)
Cassie Church presents today for UPT. She has no unusual complaint LMP: 04/18/2020    OBJECTIVE: Appears well, in no apparent distress.  OB History    Gravida  0   Para  0   Term  0   Preterm  0   AB  0   Living  0     SAB  0   TAB  0   Ectopic  0   Multiple  0   Live Births             Home UPT Result: Positive  In-Office UPT result: Positive  I have reviewed the patient's medical, obstetrical, social, and family histories, and medications.   ASSESSMENT: Positive pregnancy test  PLAN Prenatal care to be completed at: [facility]

## 2020-05-28 NOTE — Progress Notes (Signed)
I have reviewed this chart and agree with the RN/CMA assessment and management.    K. Meryl Amen Dargis, M.D. Attending [facility])   

## 2020-06-24 ENCOUNTER — Telehealth: Payer: Self-pay

## 2020-06-24 NOTE — Telephone Encounter (Signed)
Pt called and reports that she is pregnant and flying on a plan this week. She reports she gets motion sickness and typically takes Dramamine and would like to know if this is safe to take. After consulting with Dr. Clearance Coots he advises that dramamine is safe for her to take, pt voices understanding.

## 2020-07-02 ENCOUNTER — Other Ambulatory Visit: Payer: Self-pay

## 2020-07-02 ENCOUNTER — Ambulatory Visit (INDEPENDENT_AMBULATORY_CARE_PROVIDER_SITE_OTHER): Payer: 59 | Admitting: Obstetrics and Gynecology

## 2020-07-02 ENCOUNTER — Encounter: Payer: Self-pay | Admitting: Obstetrics and Gynecology

## 2020-07-02 ENCOUNTER — Ambulatory Visit (INDEPENDENT_AMBULATORY_CARE_PROVIDER_SITE_OTHER): Payer: 59

## 2020-07-02 ENCOUNTER — Other Ambulatory Visit (HOSPITAL_COMMUNITY)
Admission: RE | Admit: 2020-07-02 | Discharge: 2020-07-02 | Disposition: A | Discharge status: Home / Self Care | Payer: 59 | Source: Ambulatory Visit | Attending: Obstetrics and Gynecology | Admitting: Obstetrics and Gynecology

## 2020-07-02 VITALS — BP 119/71 | HR 90 | Wt 156.2 lb

## 2020-07-02 DIAGNOSIS — Z789 Other specified health status: Secondary | ICD-10-CM

## 2020-07-02 DIAGNOSIS — Z3401 Encounter for supervision of normal first pregnancy, first trimester: Secondary | ICD-10-CM | POA: Insufficient documentation

## 2020-07-02 DIAGNOSIS — O3680X0 Pregnancy with inconclusive fetal viability, not applicable or unspecified: Secondary | ICD-10-CM

## 2020-07-02 DIAGNOSIS — Z3A10 10 weeks gestation of pregnancy: Secondary | ICD-10-CM

## 2020-07-02 DIAGNOSIS — Z3480 Encounter for supervision of other normal pregnancy, unspecified trimester: Secondary | ICD-10-CM

## 2020-07-02 NOTE — Progress Notes (Signed)
INITIAL OBSTETRICAL VISIT Patient name: Cassie Church MRN [id number]  Date of birth: May 12, 1996 Chief Complaint:   Initial Prenatal Visit  History of Present Illness:   Cassie Church is a 24 y.o. G37P0000 African American female at [redacted]w[redacted]d by LMP & U/S today with an Estimated Date of Delivery: 01/23/21 being seen today for her initial obstetrical visit.  Her obstetrical history is significant for none. This is an unplanned pregnancy. She and the father of the baby (FOB) "Justice" do not live together at this time. She has a support system that consists of the FOB/her mother/father/friends. Today she reports nausea.   Patient's last menstrual period was 04/18/2020. Last pap 2019. Results were: normal Review of Systems:   Pertinent items are noted in HPI Denies cramping/contractions, leakage of fluid, vaginal bleeding, abnormal vaginal discharge w/ itching/odor/irritation, headaches, visual changes, shortness of breath, chest pain, abdominal pain, severe nausea/vomiting, or problems with urination or bowel movements unless otherwise stated above.  Pertinent History Reviewed:  Reviewed past medical,surgical, social, obstetrical and family history.  Reviewed problem list, medications and allergies. OB History  Gravida Para Term Preterm AB Living  1 0 0 0 0 0  SAB TAB Ectopic Multiple Live Births  0 0 0 0      # Outcome Date GA Lbr Len/2nd Weight Sex Delivery Anes PTL Lv  1 Current            Physical Assessment:   Vitals:   07/02/20 0911  BP: 119/71  Pulse: 90  Weight: 156 lb 3.2 oz (70.9 kg)  Body mass index is 25.21 kg/m.       Physical Examination:  General appearance - well appearing, and in no distress  Mental status - alert, oriented to person, place, and time  Psych:  She has a normal mood and affect  Skin - warm and dry, normal color, no suspicious lesions noted  Chest - effort normal, all lung fields clear to auscultation bilaterally  Heart - normal rate and regular  rhythm  Abdomen - soft, nontender  Extremities:  No swelling or varicosities noted  Pelvic - VULVA: normal appearing vulva with no masses, tenderness or lesions  VAGINA: normal appearing vagina with normal color and discharge, no lesions.   CERVIX: normal appearing cervix without discharge or lesions, no CMT  Thin prep pap is done without HR HPV cotesting   FHTs by doppler: 157 bpm by U/S Assessment & Plan:  1) Low-Risk Pregnancy G1P0000 at [redacted]w[redacted]d with an Estimated Date of Delivery: 01/23/21   2) Initial OB visit - Welcomed to practice and introduced self to patient in addition to discussing other advanced practice providers that she may be seeing at this practice - Congratulated patient - Anticipatory guidance on upcoming appointments - Educated on COVID19 and pregnancy and the integration of virtual appointments  - Educated on babys[employer] app- patient reports she has not received email, encouraged to look in spam folder and to call office if she still has not received email - patient verbalizes understanding    3) Encounter to determine fetal viability of pregnancy, single or unspecified fetus - [facility]>>dates c/w LMP per Liam Rogers, RN  4) [redacted] weeks gestation of pregnancy  5) Encounter for supervision of normal first pregnancy in first trimester - COVID-19 Vaccine Counseling: The patient was counseled on the potential benefits and lack of known risks of COVID vaccination, during pregnancy and breastfeeding, during today's visit. The patient's questions and concerns were addressed today, including  safety of the vaccination and potential side effects as they have been published by ACOG and [facility]. The patient has been informed that there have not been any documented vaccine related injuries, deaths or birth defects to infant or mom after receiving the COVID-19 vaccine to date. The patient has been made aware that although she is not at increased risk of contracting COVID-19 during  pregnancy, she is at increased risk of developing severe disease and complications if she contracts COVID-19 while pregnant. All patient questions were addressed during our visit today. The patient is planning to get COVID-19 booster.    Meds: No orders of the defined types were placed in this encounter.   Initial labs obtained Continue prenatal vitamins Reviewed n/v relief measures and warning s/s to report Reviewed recommended weight gain based on pre-gravid BMI Encouraged well-balanced diet Genetic Screening discussed: ordered Cystic fibrosis, SMA, Fragile X screening discussed ordered The nature of [facility] with multiple MDs and other Advanced Practice Providers was explained to patient; also emphasized that residents, students are part of our team.  Discussed optimized OB schedule and video visits. Advised can have an in-office visit whenever she feels she needs to be seen.  Does not have own BP cuff. Will need to purchase BP cuff. Check BP weekly, let us know if >140/90. Advised to call during normal business hours and there is an after-hours nurse line available.    Follow-up: No follow-ups on file.   Orders Placed This Encounter  Procedures  . [facility]    Raelyn Mora MSN, [place] 07/02/2020

## 2020-07-02 NOTE — Patient Instructions (Addendum)
First Trimester of Pregnancy The first trimester of pregnancy is from week 1 until the end of week 13 (months 1 through 3). A week after a sperm fertilizes an egg, the egg will implant on the wall of the uterus. This embryo will begin to develop into a baby. Genes from you and your partner will form the baby. The female genes will determine whether the baby will be a boy or a girl. At 6-8 weeks, the eyes and face will be formed, and the heartbeat can be seen on ultrasound. At the end of 12 weeks, all the baby's organs will be formed. Now that you are pregnant, you will want to do everything you can to have a healthy baby. Two of the most important things are to get good prenatal care and to follow your health care provider's instructions. Prenatal care is all the medical care you receive before the baby's birth. This care will help prevent, find, and treat any problems during the pregnancy and childbirth. Body changes during your first trimester Your body goes through many changes during pregnancy. The changes vary from woman to woman.  You may gain or lose a couple of pounds at first.  You may feel sick to your stomach (nauseous) and you may throw up (vomit). If the vomiting is uncontrollable, call your health care provider.  You may tire easily.  You may develop headaches that can be relieved by medicines. All medicines should be approved by your health care provider.  You may urinate more often. Painful urination may mean you have a bladder infection.  You may develop heartburn as a result of your pregnancy.  You may develop constipation because certain hormones are causing the muscles that push stool through your intestines to slow down.  You may develop hemorrhoids or swollen veins (varicose veins).  Your breasts may begin to grow larger and become tender. Your nipples may stick out more, and the tissue that surrounds them (areola) may become darker.  Your gums may bleed and may be  sensitive to brushing and flossing.  Dark spots or blotches (chloasma, mask of pregnancy) may develop on your face. This will likely fade after the baby is born.  Your menstrual periods will stop.  You may have a loss of appetite.  You may develop cravings for certain kinds of food.  You may have changes in your emotions from day to day, such as being excited to be pregnant or being concerned that something may go wrong with the pregnancy and baby.  You may have more vivid and strange dreams.  You may have changes in your hair. These can include thickening of your hair, rapid growth, and changes in texture. Some women also have hair loss during or after pregnancy, or hair that feels dry or thin. Your hair will most likely return to normal after your baby is born. What to expect at prenatal visits During a routine prenatal visit:  You will be weighed to make sure you and the baby are growing normally.  Your blood pressure will be taken.  Your abdomen will be measured to track your baby's growth.  The fetal heartbeat will be listened to between weeks 10 and 14 of your pregnancy.  Test results from any previous visits will be discussed. Your health care provider may ask you:  How you are feeling.  If you are feeling the baby move.  If you have had any abnormal symptoms, such as leaking fluid, bleeding, severe headaches, or abdominal   cramping.  If you are using any tobacco products, including cigarettes, chewing tobacco, and electronic cigarettes.  If you have any questions. Other tests that may be performed during your first trimester include:  Blood tests to find your blood type and to check for the presence of any previous infections. The tests will also be used to check for low iron levels (anemia) and protein on red blood cells (Rh antibodies). Depending on your risk factors, or if you previously had diabetes during pregnancy, you may have tests to check for high blood sugar  that affects pregnant women (gestational diabetes).  Urine tests to check for infections, diabetes, or protein in the urine.  An ultrasound to confirm the proper growth and development of the baby.  Fetal screens for spinal cord problems (spina bifida) and Down syndrome.  HIV (human immunodeficiency virus) testing. Routine prenatal testing includes screening for HIV, unless you choose not to have this test.  You may need other tests to make sure you and the baby are doing well. Follow these instructions at home: Medicines  Follow your health care provider's instructions regarding medicine use. Specific medicines may be either safe or unsafe to take during pregnancy.  Take a prenatal vitamin that contains at least 600 micrograms (mcg) of folic acid.  If you develop constipation, try taking a stool softener if your health care provider approves. Eating and drinking   Eat a balanced diet that includes fresh fruits and vegetables, whole grains, good sources of protein such as meat, eggs, or tofu, and low-fat dairy. Your health care provider will help you determine the amount of weight gain that is right for you.  Avoid raw meat and uncooked cheese. These carry germs that can cause birth defects in the baby.  Eating four or five small meals rather than three large meals a day may help relieve nausea and vomiting. If you start to feel nauseous, eating a few soda crackers can be helpful. Drinking liquids between meals, instead of during meals, also seems to help ease nausea and vomiting.  Limit foods that are high in fat and processed sugars, such as fried and sweet foods.  To prevent constipation: ? Eat foods that are high in fiber, such as fresh fruits and vegetables, whole grains, and beans. ? Drink enough fluid to keep your urine clear or pale yellow. Activity  Exercise only as directed by your health care provider. Most women can continue their usual exercise routine during  pregnancy. Try to exercise for 30 minutes at least 5 days a week. Exercising will help you: ? Control your weight. ? Stay in shape. ? Be prepared for labor and delivery.  Experiencing pain or cramping in the lower abdomen or lower back is a good sign that you should stop exercising. Check with your health care provider before continuing with normal exercises.  Try to avoid standing for long periods of time. Move your legs often if you must stand in one place for a long time.  Avoid heavy lifting.  Wear low-heeled shoes and practice good posture.  You may continue to have sex unless your health care provider tells you not to. Relieving pain and discomfort  Wear a good support bra to relieve breast tenderness.  Take warm sitz baths to soothe any pain or discomfort caused by hemorrhoids. Use hemorrhoid cream if your health care provider approves.  Rest with your legs elevated if you have leg cramps or low back pain.  If you develop varicose veins in   your legs, wear support hose. Elevate your feet for 15 minutes, 3-4 times a day. Limit salt in your diet. Prenatal care  Schedule your prenatal visits by the twelfth week of pregnancy. They are usually scheduled monthly at first, then more often in the last 2 months before delivery.  Write down your questions. Take them to your prenatal visits.  Keep all your prenatal visits as told by your health care provider. This is important. Safety  Wear your seat belt at all times when driving.  Make a list of emergency phone numbers, including numbers for family, friends, the hospital, and police and fire departments. General instructions  Ask your health care provider for a referral to a local prenatal education class. Begin classes no later than the beginning of month 6 of your pregnancy.  Ask for help if you have counseling or nutritional needs during pregnancy. Your health care provider can offer advice or refer you to specialists for help  with various needs.  Do not use hot tubs, steam rooms, or saunas.  Do not douche or use tampons or scented sanitary pads.  Do not cross your legs for long periods of time.  Avoid cat litter boxes and soil used by cats. These carry germs that can cause birth defects in the baby and possibly loss of the fetus by miscarriage or stillbirth.  Avoid all smoking, herbs, alcohol, and medicines not prescribed by your health care provider. Chemicals in these products affect the formation and growth of the baby.  Do not use any products that contain nicotine or tobacco, such as cigarettes and e-cigarettes. If you need help quitting, ask your health care provider. You may receive counseling support and other resources to help you quit.  Schedule a dentist appointment. At home, brush your teeth with a soft toothbrush and be gentle when you floss. Contact a health care provider if:  You have dizziness.  You have mild pelvic cramps, pelvic pressure, or nagging pain in the abdominal area.  You have persistent nausea, vomiting, or diarrhea.  You have a bad smelling vaginal discharge.  You have pain when you urinate.  You notice increased swelling in your face, hands, legs, or ankles.  You are exposed to fifth disease or chickenpox.  You are exposed to [place] measles (rubella) and have never had it. Get help right away if:  You have a fever.  You are leaking fluid from your vagina.  You have spotting or bleeding from your vagina.  You have severe abdominal cramping or pain.  You have rapid weight gain or loss.  You vomit blood or material that looks like coffee grounds.  You develop a severe headache.  You have shortness of breath.  You have any kind of trauma, such as from a fall or a car accident. Summary  The first trimester of pregnancy is from week 1 until the end of week 13 (months 1 through 3).  Your body goes through many changes during pregnancy. The changes vary from  woman to woman.  You will have routine prenatal visits. During those visits, your health care provider will examine you, discuss any test results you may have, and talk with you about how you are feeling. This information is not intended to replace advice given to you by your health care provider. Make sure you discuss any questions you have with your health care provider. Document Revised: 08/12/2017 Document Reviewed: 08/11/2016 Elsevier Patient Education  2020 [employer].    Morning Sickness  Morning sickness is when a woman feels nauseous during pregnancy. This nauseous feeling may or may not come with vomiting. It often occurs in the morning, but it can be a problem at any time of day. Morning sickness is most common during the first trimester. In some cases, it may continue throughout pregnancy. Although morning sickness is unpleasant, it is usually harmless unless the woman develops severe and continual vomiting (hyperemesis gravidarum), a condition that requires more intense treatment. What are the causes? The exact cause of this condition is not known, but it seems to be related to normal hormonal changes that occur in pregnancy. What increases the risk? You are more likely to develop this condition if:  You experienced nausea or vomiting before your pregnancy.  You had morning sickness during a previous pregnancy.  You are pregnant with more than one baby, such as twins. What are the signs or symptoms? Symptoms of this condition include:  Nausea.  Vomiting. How is this diagnosed? This condition is usually diagnosed based on your signs and symptoms. How is this treated? In many cases, treatment is not needed for this condition. Making some changes to what you eat may help to control symptoms. Your health care provider may also prescribe or recommend:  Vitamin B6 supplements.  Anti-nausea medicines.  Ginger. Follow these instructions at home: Medicines  Take  over-the-counter and prescription medicines only as told by your health care provider. Do not use any prescription, over-the-counter, or herbal medicines for morning sickness without first talking with your health care provider.  Taking multivitamins before getting pregnant can prevent or decrease the severity of morning sickness in most women. Eating and drinking  Eat a piece of dry toast or crackers before getting out of bed in the morning.  Eat 5 or 6 small meals a day.  Eat dry and bland foods, such as rice or a baked potato. Foods that are high in carbohydrates are often helpful.  Avoid greasy, fatty, and spicy foods.  Have someone cook for you if the smell of any food causes nausea and vomiting.  If you feel nauseous after taking prenatal vitamins, take the vitamins at night or with a snack.  Snack on protein foods between meals if you are hungry. Nuts, yogurt, and cheese are good options.  Drink fluids throughout the day.  Try ginger ale made with real ginger, ginger tea made from fresh grated ginger, or ginger candies. General instructions  Do not use any products that contain nicotine or tobacco, such as cigarettes and e-cigarettes. If you need help quitting, ask your health care provider.  Get an air purifier to keep the air in your house free of odors.  Get plenty of fresh air.  Try to avoid odors that trigger your nausea.  Consider trying these methods to help relieve symptoms: ? Wearing an acupressure wristband. These wristbands are often worn for seasickness. ? Acupuncture. Contact a health care provider if:  Your home remedies are not working and you need medicine.  You feel dizzy or light-headed.  You are losing weight. Get help right away if:  You have persistent and uncontrolled nausea and vomiting.  You faint.  You have severe pain in your abdomen. Summary  Morning sickness is when a woman feels nauseous during pregnancy. This nauseous feeling may  or may not come with vomiting.  Morning sickness is most common during the first trimester.  It often occurs in the morning, but it can be a problem at any time  of day.  In many cases, treatment is not needed for this condition. Making some changes to what you eat may help to control symptoms. This information is not intended to replace advice given to you by your health care provider. Make sure you discuss any questions you have with your health care provider. Document Revised: 08/12/2017 Document Reviewed: 10/02/2016 Elsevier Patient Education  2020 [employer]    Genetic Testing During Pregnancy Genetic testing during pregnancy is also called prenatal genetic testing. This type of testing can determine if your baby is at risk of being born with a disorder caused by abnormal genes or chromosomes (genetic disorder). Chromosomes contain genes that control how your baby will develop in your womb. There are many different genetic disorders. Examples of genetic disorders that may be found through genetic testing include Down syndrome and cystic fibrosis. Gene changes (mutations) can be passed down through families. Genetic testing is offered to all women before or during pregnancy. You can choose whether to have genetic testing. Why is genetic testing done? Genetic testing is done during pregnancy to find out whether your child is at risk for a genetic disorder. Having genetic testing allows you to:  Discuss your test results and options with a genetic counselor.  Prepare for a baby that may be born with a genetic disorder. Learning about the disorder ahead of time helps you be better prepared to manage it. Your health care providers can also be prepared in case your baby requires special care before or after birth.  Consider whether you want to continue with the pregnancy. In some cases, genetic testing may be done to learn about the traits a child will inherit. Types of genetic  tests There are two basic types of genetic testing. Screening tests indicate whether your developing baby (fetus) is at higher risk for a genetic disorder. Diagnostic tests check actual fetal cells to diagnose a genetic disorder. Screening tests     Screening tests will not harm your baby. They are recommended for all pregnant women. Types of screening tests include:  Carrier screening. This test involves checking genes from both parents by testing their blood or saliva. The test checks to find out if the parents carry a genetic mutation that may be passed to a baby. In most cases, both parents must carry the mutation for a baby to be at risk.  First trimester screening. This test combines a blood test with sound wave imaging of your baby (fetal ultrasound). This screening test checks for a risk of Down syndrome or other defects caused by having extra chromosomes. It also checks for defects of the heart, abdomen, or skeleton.  Second trimester screening also combines a blood test with a fetal ultrasound exam. It checks for a risk of genetic defects of the face, brain, spine, heart, or limbs.  Combined or sequential screening. This type of testing combines the results of first and second trimester screening. This type of testing may be more accurate than first or second trimester screening alone.  Cell-free DNA testing. This is a blood test that detects cells released by the placenta that get into the mother's blood. It can be used to check for a risk of Down syndrome, other extra chromosome syndromes, and disorders caused by abnormal numbers of sex chromosomes. This test can be done any time after 10 weeks of pregnancy.  Diagnostic tests Diagnostic tests carry slight risks of problems, including bleeding, infection, and loss of the pregnancy. These tests are done only  if your baby is at risk for a genetic disorder. You may meet with a genetic counselor to discuss the risks and benefits before  having diagnostic tests. Examples of diagnostic tests include:  Chorionic villus sampling (CVS). This involves a procedure to remove and test a sample of cells taken from the placenta. The procedure may be done between 10 and 12 weeks of pregnancy.  Amniocentesis. This involves a procedure to remove and test a sample of fluid (amniotic fluid) and cells from the sac that surrounds the developing baby. The procedure may be done between 15 and 20 weeks of pregnancy. What do the results mean? For a screening test:  If the results are negative, it often means that your child is not at higher risk. There is still a slight chance your child could have a genetic disorder.  If the results are positive, it does not mean your child will have a genetic disorder. It may mean that your child has a higher-than-normal risk for a genetic disorder. In that case, you may want to talk with a genetic counselor about whether you should have diagnostic genetic tests. For a diagnostic test:  If the result is negative, it is unlikely that your child will have a genetic disorder.  If the test is positive for a genetic disorder, it is likely that your child will have the disorder. The test may not tell how severe the disorder will be. Talk with your health care provider about your options. Questions to ask your health care provider Before talking to your health care provider about genetic testing, find out if there is a history of genetic disorders in your family. It may also help to know your family's ethnic origins. Then ask your health care provider the following questions:  Is my baby at risk for a genetic disorder?  What are the benefits of having genetic screening?  What tests are best for me and my baby?  What are the risks of each test?  If I get a positive result on a screening test, what is the next step?  Should I meet with a genetic counselor before having a diagnostic test?  Should my partner or  other members of my family be tested?  How much do the tests cost? Will my insurance cover the testing? Summary  Genetic testing is done during pregnancy to find out whether your child is at risk for a genetic disorder.  Genetic testing is offered to all women before or during pregnancy. You can choose whether to have genetic testing.  There are two basic types of genetic testing. Screening tests indicate whether your developing baby (fetus) is at higher risk for a genetic disorder. Diagnostic tests check actual fetal cells to diagnose a genetic disorder.  If a diagnostic genetic test is positive, talk with your health care provider about your options. This information is not intended to replace advice given to you by your health care provider. Make sure you discuss any questions you have with your health care provider. Document Revised: 12/21/2018 Document Reviewed: 11/14/2017 Elsevier Patient Education  2020 [employer].

## 2020-07-02 NOTE — Progress Notes (Signed)
Pt is here for initial OB visit.   EDD 01/23/21.   Pt reports c/o pregnancy related nausea.

## 2020-07-03 ENCOUNTER — Other Ambulatory Visit: Payer: Self-pay | Admitting: Obstetrics and Gynecology

## 2020-07-03 DIAGNOSIS — B9689 Other specified bacterial agents as the cause of diseases classified elsewhere: Secondary | ICD-10-CM

## 2020-07-03 LAB — CBC/D/PLT+RPR+RH+ABO+RUB AB...
Antibody Screen: NEGATIVE
Basophils Absolute: 0 10*3/uL (ref 0.0–0.2)
Basos: 0 %
EOS (ABSOLUTE): 0 10*3/uL (ref 0.0–0.4)
Eos: 0 %
HCV Ab: <0.1 s/co ratio (ref 0.0–0.9)
HIV Screen 4th Generation wRfx: NONREACTIVE
Hematocrit: 37.9 % (ref 34.0–46.6)
Hemoglobin: 12.4 g/dL (ref 11.1–15.9)
Hepatitis B Surface Ag: NEGATIVE
Immature Grans (Abs): 0 10*3/uL (ref 0.0–0.1)
Immature Granulocytes: 0 %
Lymphocytes Absolute: 1.6 10*3/uL (ref 0.7–3.1)
Lymphs: 29 %
MCH: 27.1 pg (ref 26.6–33.0)
MCHC: 32.7 g/dL (ref 31.5–35.7)
MCV: 83 fL (ref 79–97)
Monocytes Absolute: 0.3 10*3/uL (ref 0.1–0.9)
Monocytes: 5 %
Neutrophils Absolute: 3.8 10*3/uL (ref 1.4–7.0)
Neutrophils: 66 %
Platelets: 198 10*3/uL (ref 150–450)
RBC: 4.57 x10E6/uL (ref 3.77–5.28)
RDW: 14.5 % (ref 11.7–15.4)
RPR Ser Ql: NONREACTIVE
Rh Factor: POSITIVE
Rubella Antibodies, IGG: 3.17 index (ref >0.99)
WBC: 5.7 10*3/uL (ref 3.4–10.8)

## 2020-07-03 LAB — CYTOLOGY - PAP

## 2020-07-03 LAB — HCV INTERPRETATION

## 2020-07-03 MED ORDER — METRONIDAZOLE 0.75 % VA GEL: 1.0000 | Freq: Every day | VAGINAL | 0 refills | Status: AC

## 2020-07-03 NOTE — Progress Notes (Signed)
Metrogel Rx'd per pt request

## 2020-07-04 LAB — CERVICOVAGINAL ANCILLARY ONLY
Bacterial Vaginitis (gardnerella): POSITIVE — AB
Candida Glabrata: NEGATIVE
Candida Vaginitis: NEGATIVE
Chlamydia: NEGATIVE
Comment: NEGATIVE
Comment: NEGATIVE
Comment: NEGATIVE
Comment: NEGATIVE
Comment: NEGATIVE
Comment: NORMAL
Neisseria Gonorrhea: NEGATIVE
Trichomonas: NEGATIVE

## 2020-07-05 LAB — URINE CULTURE, OB REFLEX

## 2020-07-05 LAB — CULTURE, OB URINE

## 2020-07-08 ENCOUNTER — Other Ambulatory Visit: Payer: Self-pay | Admitting: Obstetrics and Gynecology

## 2020-07-08 ENCOUNTER — Encounter: Payer: Self-pay | Admitting: Obstetrics and Gynecology

## 2020-07-08 DIAGNOSIS — O2341 Unspecified infection of urinary tract in pregnancy, first trimester: Secondary | ICD-10-CM

## 2020-07-08 MED ORDER — CEFADROXIL 500 MG PO CAPS: 500.0000 mg | ORAL_CAPSULE | Freq: Two times a day (BID) | ORAL | 0 refills | Status: AC

## 2020-07-14 ENCOUNTER — Encounter: Payer: Self-pay | Admitting: Obstetrics and Gynecology

## 2020-07-14 DIAGNOSIS — D563 Thalassemia minor: Secondary | ICD-10-CM | POA: Insufficient documentation

## 2020-07-30 ENCOUNTER — Telehealth (INDEPENDENT_AMBULATORY_CARE_PROVIDER_SITE_OTHER): Payer: 59 | Admitting: Obstetrics

## 2020-07-30 ENCOUNTER — Encounter: Payer: Self-pay | Admitting: Obstetrics

## 2020-07-30 VITALS — BP 116/65 | HR 73

## 2020-07-30 DIAGNOSIS — D563 Thalassemia minor: Secondary | ICD-10-CM

## 2020-07-30 DIAGNOSIS — Z3A14 14 weeks gestation of pregnancy: Secondary | ICD-10-CM

## 2020-07-30 DIAGNOSIS — Z3480 Encounter for supervision of other normal pregnancy, unspecified trimester: Secondary | ICD-10-CM

## 2020-07-30 DIAGNOSIS — O99012 Anemia complicating pregnancy, second trimester: Secondary | ICD-10-CM

## 2020-07-30 DIAGNOSIS — B373 Candidiasis of vulva and vagina: Secondary | ICD-10-CM

## 2020-07-30 DIAGNOSIS — O98812 Other maternal infectious and parasitic diseases complicating pregnancy, second trimester: Secondary | ICD-10-CM

## 2020-07-30 DIAGNOSIS — B3731 Acute candidiasis of vulva and vagina: Secondary | ICD-10-CM

## 2020-07-30 MED ORDER — TERCONAZOLE 0.4 % VA CREA: 1.0000 | TOPICAL_CREAM | Freq: Every day | VAGINAL | 0 refills | Status: DC

## 2020-07-30 NOTE — Progress Notes (Signed)
I connected with Cassie Church on 07/30/20 at 10:00 AM EST by telephone and verified that I am speaking with the correct person using two identifiers.  Pt presents for ROB c/o vaginal irritation after IC Denies abnormal vaginal discharge

## 2020-07-30 NOTE — Progress Notes (Addendum)
OBSTETRICS PRENATAL VIRTUAL VISIT ENCOUNTER NOTE  Provider location: [facility]   I connected with Cassie Church on 07/30/20 at 10:00 AM EST by MyChart Video Encounter at home and verified that I am speaking with the correct person using two identifiers.   I discussed the limitations, risks, security and privacy concerns of performing an evaluation and management service virtually and the availability of in person appointments. I also discussed with the patient that there may be a patient responsible charge related to this service. The patient expressed understanding and agreed to proceed.  Subjective:  Cassie Church is a 24 y.o. G1P0000 at [redacted]w[redacted]d being seen today for ongoing prenatal care.  She is currently monitored for the following issues for this low-risk pregnancy and has Supervision of normal pregnancy, antepartum and Alpha thalassemia silent carrier on their problem list.  Patient reports vaginal irritation.  Contractions: Not present. Vag. Bleeding: None.   . Denies any leaking of fluid.   The following portions of the patient's history were reviewed and updated as appropriate: allergies, current medications, past family history, past medical history, past social history, past surgical history and problem list.   Objective:   Vitals:   07/30/20 1016  BP: 116/65  Pulse: 73    Fetal Status:           General:  Alert, oriented and cooperative. Patient is in no acute distress.  Respiratory: Normal respiratory effort, no problems with respiration noted  Mental Status: Normal mood and affect. Normal behavior. Normal judgment and thought content.  Rest of physical exam deferred due to type of encounter  Imaging: US OB Limited  Result Date: 07/03/2020 ----------------------------------------------------------------------  OBSTETRICS REPORT                       (Signed Final 07/03/2020 03:18 pm)  ---------------------------------------------------------------------- Patient Info  ID #:       [id number]                          D.O.B.:  [birth year] (24 yrs)  Name:       Cassie Church                   Visit Date: 07/02/2020 09:36 am ---------------------------------------------------------------------- Performed By  Attending:        Mariel Aloe MD       Ref. Address:      [address]                                                              [place], [place]  [address]  Performed By:     Angelica Pou RN         Location:          [facility]                                                              [facility]  Referred By:      Raelyn Mora CNM ---------------------------------------------------------------------- Orders  #  Description                           Code        Ordered By  1  US OB LIMITED                         03014.9     PULGSPJ DAWSON ----------------------------------------------------------------------  #  Order #                     Accession #                Episode #  1  [id number]                   [id number]                 [id number] ---------------------------------------------------------------------- Indications  [redacted] weeks gestation of pregnancy                 Z3A.10  Pregnancy with inconclusive fetal viability     O36.80X0 ---------------------------------------------------------------------- Fetal Evaluation  Num Of Fetuses:          1  Fetal Heart              157  Rate(bpm):  Cardiac Activity:        Observed  Comment:    10.6 week IUP, EDD 01/22/2021 ---------------------------------------------------------------------- Biometry  CRL:      40.5  mm     G. Age:  10w 6d                   EDD:    01/22/21 ---------------------------------------------------------------------- Gestational Age  Best:          10w 6d     Det. By:  U/S C R L (07/02/20)     EDD:    01/22/21 ---------------------------------------------------------------------- Comments  Single live IUP at [redacted]w[redacted]d by CRL. LMP 04/18/20. FHR 157 ----------------------------------------------------------------------  Mariel Aloe, MD Electronically Signed Final Report   07/03/2020 03:18 pm ----------------------------------------------------------------------   Assessment and Plan:  Pregnancy: G1P0000 at [redacted]w[redacted]d 1. Supervision of other normal pregnancy, antepartum Rx: - [place] MFM OB COMP + 14 WK; Future  2. Alpha thalassemia silent carrier   Preterm labor symptoms and general obstetric precautions including but not limited to vaginal bleeding, contractions, leaking of fluid and fetal movement were reviewed in detail with the patient. I discussed the assessment and treatment plan with the patient. The patient was provided an opportunity to ask questions and all were answered. The patient agreed with the plan and demonstrated an understanding of the instructions. The patient was advised to call back or seek an in-person office evaluation/go to MAU at [facility] for any urgent or concerning symptoms. Please refer to After Visit Summary for other counseling recommendations.   I provided 15 minutes of face-to-face time during this encounter.  Return in about 4 weeks (around 08/27/2020) for MyChart.  Coral Ceo, MD [facility], [facility] 07/30/20

## 2020-08-27 ENCOUNTER — Encounter: Payer: Self-pay | Admitting: Obstetrics

## 2020-08-27 ENCOUNTER — Telehealth (INDEPENDENT_AMBULATORY_CARE_PROVIDER_SITE_OTHER): Payer: 59 | Admitting: Obstetrics

## 2020-08-27 DIAGNOSIS — Z3480 Encounter for supervision of other normal pregnancy, unspecified trimester: Secondary | ICD-10-CM

## 2020-08-27 DIAGNOSIS — Z3A18 18 weeks gestation of pregnancy: Secondary | ICD-10-CM

## 2020-08-27 DIAGNOSIS — Z3482 Encounter for supervision of other normal pregnancy, second trimester: Secondary | ICD-10-CM

## 2020-08-27 NOTE — Progress Notes (Signed)
   OBSTETRICS PRENATAL VIRTUAL VISIT ENCOUNTER NOTE  Provider location: [facility]   I connected with [name] on 08/27/20 at 10:00 AM EST by MyChart Video Encounter at home and verified that I am speaking with the correct person using two identifiers.   I discussed the limitations, risks, security and privacy concerns of performing an evaluation and management service virtually and the availability of in person appointments. I also discussed with the patient that there may be a patient responsible charge related to this service. The patient expressed understanding and agreed to proceed. Subjective:  Cassie Church is a 24 y.o. G1P0000 at [redacted]w[redacted]d being seen today for ongoing prenatal care.  She is currently monitored for the following issues for this low-risk pregnancy and has Supervision of normal pregnancy, antepartum and Alpha thalassemia silent carrier on their problem list.  Patient reports no complaints.   .  .   . Denies any leaking of fluid.   The following portions of the patient's history were reviewed and updated as appropriate: allergies, current medications, past family history, past medical history, past social history, past surgical history and problem list.   Objective:  There were no vitals filed for this visit.  Fetal Status:           General:  Alert, oriented and cooperative. Patient is in no acute distress.  Respiratory: Normal respiratory effort, no problems with respiration noted  Mental Status: Normal mood and affect. Normal behavior. Normal judgment and thought content.  Rest of physical exam deferred due to type of encounter  Imaging: No results found.  Assessment and Plan:  Pregnancy: G1P0000 at [redacted]w[redacted]d 1. Supervision of other normal pregnancy, antepartum   Preterm labor symptoms and general obstetric precautions including but not limited to vaginal bleeding, contractions, leaking of fluid and fetal movement were reviewed in  detail with the patient. I discussed the assessment and treatment plan with the patient. The patient was provided an opportunity to ask questions and all were answered. The patient agreed with the plan and demonstrated an understanding of the instructions. The patient was advised to call back or seek an in-person office evaluation/go to MAU at [facility] for any urgent or concerning symptoms. Please refer to After Visit Summary for other counseling recommendations.   I provided 15 minutes of face-to-face time during this encounter.  Return in about 4 weeks (around 09/24/2020) for MyChart.  Future Appointments  Date Time Provider Department Center  09/02/2020 10:45 AM [facility] US5 [facility]    Coral Ceo, MD [facility] for [facility], [facility] 08/27/20

## 2020-09-02 ENCOUNTER — Other Ambulatory Visit: Payer: Self-pay | Admitting: Obstetrics

## 2020-09-02 ENCOUNTER — Other Ambulatory Visit: Payer: Self-pay

## 2020-09-02 ENCOUNTER — Ambulatory Visit: Payer: 59 | Attending: Obstetrics and Gynecology

## 2020-09-02 DIAGNOSIS — Z3480 Encounter for supervision of other normal pregnancy, unspecified trimester: Secondary | ICD-10-CM | POA: Insufficient documentation

## 2020-09-02 DIAGNOSIS — Z148 Genetic carrier of other disease: Secondary | ICD-10-CM | POA: Diagnosis not present

## 2020-09-13 NOTE — L&D Delivery Note (Addendum)
Delivery Note:   G1P0000 at [redacted]w[redacted]d  Admitting diagnosis: Supervision of low-risk first pregnancy [Z34.00] Risks: PPH d/t gestational thrombocytopenia.  Onset of labor: 5/5 @1100  IOL/Augmentation: Pitocin ROM: SROM @1100 . Clear fluid   Complete dilation at 01/16/2021  @0103  Onset of pushing at 0115 FHR second stage Cat II, reassuring with moderate variability. Recurrent variables with pushing, with quick return to baseline.   Analgesia intrapartum:IV fentanyl x1. Cassie Church pushing in lithotomy position with good maternal efforts. SNM, CNM and L&D staff support encouraging mom at bedside.  FOB Cassie Church and his mother present for birth and supportive.  Delivery of a Live born female  Birth Weight:  Pending  APGAR: 5, 9  Newborn Delivery   Birth date/time: 01/16/2021 01:44:00 Delivery type: Vaginal, Spontaneous     Infant delivered in cephalic presentation in LOA position and restituted to LOT. Fetal head delivered with ease by SNM. Turtle sign noted. SNM attempted to reach the left posterior shoulder for overhead sweep. Shoulder dystocia identified. CNM successfully hooked the posterior (L) axilla, rotated baby clockwise and released the (now anterior) arm; SNM delivered (now posterior) right arm and remaining fetal body with ease. At no point was downward traction applied to fetal head. Infant dried and stimulated S2S with mom. Poor, tone and color noted. Cord clamped and cut and taken to warmer by RN. Soon after detatchment infant vigorously crying and returned S2S with mom.    .  APGAR:1 min-5 , 5 min-9  10 min-  Nuchal Cord: Yes  and crossbody Cord double clamped after 45 seconds or so by SNM, cut by FOB.  Collection of cord blood for typing completed. Cord blood donation-None  Arterial cord blood sample-yes. Artery. pH 7.17.   Placenta delivered Spontaneously with gentle cord traction and maternal pushing effort. Intact with 3 vessels . Uterotonics: IV pitocin Placenta to L&D  for disposal. Uterine tone firm with minimal bleeding   2nd degree  laceration and left labial splay identified.  Episiotomy:None  Local analgesia: 1% Lidocaine  Repair: 2nd degree repaired with a 2.0 Vicryl in traditional fashion. Tissues well approximated with good hemostasis. Left labial splay hemostatic.  Est. Blood Loss (mL):165  Complications: 30sec shoulder dystocia.    Mom to postpartum.  Baby to Couplet care / Skin to Skin.  Delivery Report:  Review the Delivery Report for details.    Cassie Church ) Eliezer Lofts, BSN, RNC-OB  Student Nurse-Midwife   01/16/2021  2:57 AM   The above was performed under my direct supervision and guidance.

## 2020-09-24 ENCOUNTER — Telehealth (INDEPENDENT_AMBULATORY_CARE_PROVIDER_SITE_OTHER): Payer: 59 | Admitting: Obstetrics

## 2020-09-24 ENCOUNTER — Other Ambulatory Visit: Payer: Self-pay

## 2020-09-24 ENCOUNTER — Encounter: Payer: Self-pay | Admitting: Obstetrics

## 2020-09-24 VITALS — BP 122/64 | HR 64

## 2020-09-24 DIAGNOSIS — O99012 Anemia complicating pregnancy, second trimester: Secondary | ICD-10-CM

## 2020-09-24 DIAGNOSIS — D563 Thalassemia minor: Secondary | ICD-10-CM

## 2020-09-24 DIAGNOSIS — Z3A22 22 weeks gestation of pregnancy: Secondary | ICD-10-CM

## 2020-09-24 DIAGNOSIS — Z3480 Encounter for supervision of other normal pregnancy, unspecified trimester: Secondary | ICD-10-CM

## 2020-09-24 NOTE — Progress Notes (Signed)
OBSTETRICS PRENATAL VIRTUAL VISIT ENCOUNTER NOTE  Provider location: [facility]   I connected with Cassie Church on 09/24/20 at 10:00 AM EST by MyChart Video Encounter at home and verified that I am speaking with the correct person using two identifiers.   I discussed the limitations, risks, security and privacy concerns of performing an evaluation and management service virtually and the availability of in person appointments. I also discussed with the patient that there may be a patient responsible charge related to this service. The patient expressed understanding and agreed to proceed. Subjective:  Cassie Church is a 25 y.o. G1P0000 at [redacted]w[redacted]d being seen today for ongoing prenatal care.  She is currently monitored for the following issues for this low-risk pregnancy and has Supervision of normal pregnancy, antepartum and Alpha thalassemia silent carrier on their problem list.  Patient reports no complaints.  Contractions: Not present. Vag. Bleeding: None.  Movement: Present. Denies any leaking of fluid.   The following portions of the patient's history were reviewed and updated as appropriate: allergies, current medications, past family history, past medical history, past social history, past surgical history and problem list.   Objective:   Vitals:   09/24/20 0940  BP: 122/64  Pulse: 64    Fetal Status:     Movement: Present     General:  Alert, oriented and cooperative. Patient is in no acute distress.  Respiratory: Normal respiratory effort, no problems with respiration noted  Mental Status: Normal mood and affect. Normal behavior. Normal judgment and thought content.  Rest of physical exam deferred due to type of encounter  Imaging: [place] MFM OB DETAIL +14 WK  Result Date: 09/02/2020 ----------------------------------------------------------------------  OBSTETRICS REPORT                       (Signed Final 09/02/2020 03:53 pm)  ---------------------------------------------------------------------- Patient Info  ID #:       [id number]                          D.O.B.:  May 28, 1996 (24 yrs)  Name:       Cassie Church [facility]                   Visit Date: 09/02/2020 10:50 am ---------------------------------------------------------------------- Performed By  Attending:        Noralee Space MD        Ref. Address:     [address]                                                             [place], [place]  [id number]  Performed By:     Truitt Leepiana Strickland,      [facility]                                [facility]  Referred By:      Raelyn MoraOLITTA                    DAWSON CNM ---------------------------------------------------------------------- Orders  #  Description                           Code        Ordered By  1  US MFM OB DETAIL +14 WK               76811.01    Coral CeoHARLES HARPER ----------------------------------------------------------------------  #  Order #                     Accession #                Episode #  1  [id number]                   [id number]                 [id number] ---------------------------------------------------------------------- Indications  Encounter for antenatal screening for          Z36.3  malformations  Genetic carrier (silent carrier - Alpha Thal.) Z14.8  [redacted] weeks gestation of pregnancy                Z3A.19  LR NIPS ---------------------------------------------------------------------- Fetal Evaluation  Num Of Fetuses:         1  Cardiac Activity:       Observed  Presentation:           Breech  Placenta:               Anterior  P. Cord Insertion:      Visualized, central  Amniotic Fluid  AFI FV:      Within normal  limits ---------------------------------------------------------------------- Biometry  BPD:      44.3  mm     G. Age:  19w 3d         36  %    CI:        72.04   %    70 - 86  FL/HC:      19.0   %    16.8 - 19.8  HC:      166.1  mm     G. Age:  19w 2d         24  %    HC/AC:      1.19        1.09 - 1.39  AC:      139.3  mm     G. Age:  19w 2d         32  %    FL/BPD:     71.3   %  FL:       31.6  mm     G. Age:  19w 6d         46  %    FL/AC:      22.7   %    20 - 24  HUM:      31.2  mm     G. Age:  20w 3d         70  %  CER:      20.3  mm     G. Age:  19w 4d         58  %  NFT:       3.7  mm  LV:        6.2  mm  CM:        5.7  mm  Est. FW:     298  gm    0 lb 11 oz      35  % ---------------------------------------------------------------------- Gestational Age  U/S Today:     19w 3d                                        EDD:   01/24/21  Best:          19w 5d     Det. By:  U/S C R L  (07/02/20)    EDD:   01/22/21 ---------------------------------------------------------------------- Anatomy  Cranium:               Appears normal         LVOT:                   Appears normal  Cavum:                 Appears normal         Aortic Arch:            Appears normal  Ventricles:            Appears normal         Ductal Arch:            Appears normal  Choroid Plexus:        Appears normal         Diaphragm:              Appears normal  Cerebellum:            Appears normal         Stomach:                Appears normal, left  sided  Posterior Fossa:       Appears normal         Abdomen:                Appears normal  Nuchal Fold:           Appears normal         Abdominal Wall:         Appears nml (cord                                                                        insert, abd wall)  Face:                  Appears normal         Cord Vessels:           Appears normal (3                          (orbits and profile)                           vessel cord)  Lips:                  Appears normal         Kidneys:                Appear normal  Palate:                Not well visualized    Bladder:                Appears normal  Thoracic:              Appears normal         Spine:                  Appears normal  Heart:                 Appears normal         Upper Extremities:      Appears normal                         (4CH, axis, and                         situs)  RVOT:                  Appears normal         Lower Extremities:      Appears normal  Other:  Parents do not wish to know sex of fetus. Female fetus.  Nasal bone          visualized. Heels/feet and open hands/5th digits visualized. VC, 3VV          and 3VTV visualized. ---------------------------------------------------------------------- Cervix Uterus Adnexa  Cervix  Length:           3.04  cm.  Right Ovary  Within normal limits.  Left Ovary  Within normal limits. ---------------------------------------------------------------------- Impression  G1 P0. Patient is here for fetal  anatomy scan.  On cell-free fetal DNA screening, the risks of fetal  aneuploidies are not increased .  We performed fetal anatomy scan. No makers of  aneuploidies or fetal structural defects are seen. Fetal  biometry is consistent with her previously-established dates.  Amniotic fluid is normal and good fetal activity is seen.  Patient understands the limitations of ultrasound in detecting  fetal anomalies.  "MyChart":Patient does not want to know fetal sex/gender.  We informed her that fetal sex/gender will be mentioned in  ultrasound report and will be in "MyChart" that will be  accessible to the patient .  Patient is a silent carrier for alpha thalassemia (aa/a-) . I  discussed carrier screening. The couple wants to wait till after  delivery. She opted not to meet with our genetic counselor. ----------------------------------------------------------------------  Recommendations  Follow-up scans as clinically indicated. ----------------------------------------------------------------------                  Noralee Space, MD Electronically Signed Final Report   09/02/2020 03:53 pm ----------------------------------------------------------------------   Assessment and Plan:  Pregnancy: G1P0000 at [redacted]w[redacted]d 1. Supervision of other normal pregnancy, antepartum  2. Alpha thalassemia silent carrier   Preterm labor symptoms and general obstetric precautions including but not limited to vaginal bleeding, contractions, leaking of fluid and fetal movement were reviewed in detail with the patient. I discussed the assessment and treatment plan with the patient. The patient was provided an opportunity to ask questions and all were answered. The patient agreed with the plan and demonstrated an understanding of the instructions. The patient was advised to call back or seek an in-person office evaluation/go to MAU at [facility] for any urgent or concerning symptoms. Please refer to After Visit Summary for other counseling recommendations.   I provided 15 minutes of face-to-face time during this encounter.  Return in about 4 weeks (around 10/22/2020) for ROB, 2 hour OGTT.  No future appointments.  Coral Ceo, MD [facility] for [facility], [facility] 09/24/20

## 2020-09-24 NOTE — Progress Notes (Signed)
Patient presents for Mychart ROB. Patient identified with 2 patient identifiers. Her BP today is 122/64. Patient has no concerns today.

## 2020-10-22 ENCOUNTER — Other Ambulatory Visit: Payer: 59

## 2020-10-22 ENCOUNTER — Other Ambulatory Visit: Payer: Self-pay

## 2020-10-22 ENCOUNTER — Ambulatory Visit (INDEPENDENT_AMBULATORY_CARE_PROVIDER_SITE_OTHER): Payer: 59 | Admitting: Women's Health

## 2020-10-22 VITALS — BP 121/71 | HR 95 | Wt 172.0 lb

## 2020-10-22 DIAGNOSIS — Z3400 Encounter for supervision of normal first pregnancy, unspecified trimester: Secondary | ICD-10-CM

## 2020-10-22 DIAGNOSIS — Z3A26 26 weeks gestation of pregnancy: Secondary | ICD-10-CM

## 2020-10-22 DIAGNOSIS — D563 Thalassemia minor: Secondary | ICD-10-CM

## 2020-10-22 NOTE — Progress Notes (Signed)
+   Fetal movement. No complaints. Pt would like to defer TDap until next visit.

## 2020-10-22 NOTE — Progress Notes (Signed)
Subjective:  Cassie Church is a 25 y.o. G1P0000 at [redacted]w[redacted]d being seen today for ongoing prenatal care.  She is currently monitored for the following issues for this low-risk pregnancy and has Supervision of normal pregnancy, antepartum and Alpha thalassemia silent carrier on their problem list.  Patient reports no complaints.  Contractions: Not present. Vag. Bleeding: None.  Movement: Present. Denies leaking of fluid.   The following portions of the patient's history were reviewed and updated as appropriate: allergies, current medications, past family history, past medical history, past social history, past surgical history and problem list. Problem list updated.  Objective:   Vitals:   10/22/20 0900  BP: 121/71  Pulse: 95  Weight: 172 lb (78 kg)    Fetal Status: Fetal Heart Rate (bpm): 143 Fundal Height: 27 cm Movement: Present     General:  Alert, oriented and cooperative. Patient is in no acute distress.  Skin: Skin is warm and dry. No rash noted.   Cardiovascular: Normal heart rate noted  Respiratory: Normal respiratory effort, no problems with respiration noted  Abdomen: Soft, gravid, appropriate for gestational age. Pain/Pressure: Present     Pelvic: Vag. Bleeding: None     Cervical exam deferred        Extremities: Normal range of motion.  Edema: Trace  Mental Status: Normal mood and affect. Normal behavior. Normal judgment and thought content.   Urinalysis:      Assessment and Plan:  Pregnancy: G1P0000 at [redacted]w[redacted]d  1. Supervision of normal first pregnancy, antepartum -28wk labs/GTT today -Tdap next visit, info given -discussed contraception, info given -peds list  Given -CBE info given  2. Alpha thalassemia silent carrier -pt declined GC at anatomy scan  3. [redacted] weeks gestation of pregnancy  Preterm labor symptoms and general obstetric precautions including but not limited to vaginal bleeding, contractions, leaking of fluid and fetal movement were reviewed in detail with  the patient. I discussed the assessment and treatment plan with the patient. The patient was provided an opportunity to ask questions and all were answered. The patient agreed with the plan and demonstrated an understanding of the instructions. The patient was advised to call back or seek an in-person office evaluation/go to MAU at [facility] for any urgent or concerning symptoms. Please refer to After Visit Summary for other counseling recommendations.  Return in about 3 weeks (around 11/12/2020) for in-person LOB/APP OK/Tdap/BP Cuff check.   Nikaela Coyne, Odie Sera, NP

## 2020-10-22 NOTE — Patient Instructions (Addendum)
Maternity Assessment Unit (MAU)  The Maternity Assessment Unit (MAU) is located at the [facility] The address is: [address], [place], [place], [place] [address]. Please see map below for additional directions.    The Maternity Assessment Unit is designed to help you during your pregnancy, and for up to 6 weeks after delivery, with any pregnancy- or postpartum-related emergencies, if you think you are in labor, or if your water has broken. For example, if you experience nausea and vomiting, vaginal bleeding, severe abdominal or pelvic pain, elevated blood pressure or other problems related to your pregnancy or postpartum time, please come to the Maternity Assessment Unit for assistance.        Contraception Choices Contraception, also called birth control, refers to methods or devices that prevent pregnancy. Hormonal methods Contraceptive implant A contraceptive implant is a thin, plastic tube that contains a hormone that prevents pregnancy. It is different from an intrauterine device (IUD). It is inserted into the upper part of the arm by a health care provider. Implants can be effective for up to 3 years. Progestin-only injections Progestin-only injections are injections of progestin, a synthetic form of the hormone progesterone. They are given every 3 months by a health care provider. Birth control pills Birth control pills are pills that contain hormones that prevent pregnancy. They must be taken once a day, preferably at the same time each day. A prescription is needed to use this method of contraception. Birth control patch The birth control patch contains hormones that prevent pregnancy. It is placed on the skin and must be changed once a week for three weeks and removed on the fourth week. A prescription is needed to use this method of contraception. Vaginal ring A vaginal ring contains hormones that prevent pregnancy. It is  placed in the vagina for three weeks and removed on the fourth week. After that, the process is repeated with a new ring. A prescription is needed to use this method of contraception. Emergency contraceptive Emergency contraceptives prevent pregnancy after unprotected sex. They come in pill form and can be taken up to 5 days after sex. They work best the sooner they are taken after having sex. Most emergency contraceptives are available without a prescription. This method should not be used as your only form of birth control.   Barrier methods Female condom A female condom is a thin sheath that is worn over the penis during sex. Condoms keep sperm from going inside a woman's body. They can be used with a sperm-killing substance (spermicide) to increase their effectiveness. They should be thrown away after one use. Female condom A female condom is a soft, loose-fitting sheath that is put into the vagina before sex. The condom keeps sperm from going inside a woman's body. They should be thrown away after one use. Diaphragm A diaphragm is a soft, dome-shaped barrier. It is inserted into the vagina before sex, along with a spermicide. The diaphragm blocks sperm from entering the uterus, and the spermicide kills sperm. A diaphragm should be left in the vagina for 6-8 hours after sex and removed within 24 hours. A diaphragm is prescribed and fitted by a health care provider. A diaphragm should be replaced every 1-2 years, after giving birth, after gaining more than 15 lb (6.8 kg), and after pelvic surgery. Cervical cap A cervical cap is a round, soft latex or plastic cup that fits over the cervix. It is inserted into the vagina before  sex, along with spermicide. It blocks sperm from entering the uterus. The cap should be left in place for 6-8 hours after sex and removed within 48 hours. A cervical cap must be prescribed and fitted by a health care provider. It should be replaced every 2 years. Sponge A sponge is  a soft, circular piece of polyurethane foam with spermicide in it. The sponge helps block sperm from entering the uterus, and the spermicide kills sperm. To use it, you make it wet and then insert it into the vagina. It should be inserted before sex, left in for at least 6 hours after sex, and removed and thrown away within 30 hours. Spermicides Spermicides are chemicals that kill or block sperm from entering the cervix and uterus. They can come as a cream, jelly, suppository, foam, or tablet. A spermicide should be inserted into the vagina with an applicator at least 10-15 minutes before sex to allow time for it to work. The process must be repeated every time you have sex. Spermicides do not require a prescription.   Intrauterine contraception Intrauterine device (IUD) An IUD is a T-shaped device that is put in a woman's uterus. There are two types:  Hormone IUD.This type contains progestin, a synthetic form of the hormone progesterone. This type can stay in place for 3-5 years.  Copper IUD.This type is wrapped in copper wire. It can stay in place for 10 years. Permanent methods of contraception Female tubal ligation In this method, a woman's fallopian tubes are sealed, tied, or blocked during surgery to prevent eggs from traveling to the uterus. Hysteroscopic sterilization In this method, a small, flexible insert is placed into each fallopian tube. The inserts cause scar tissue to form in the fallopian tubes and block them, so sperm cannot reach an egg. The procedure takes about 3 months to be effective. Another form of birth control must be used during those 3 months. Female sterilization This is a procedure to tie off the tubes that carry sperm (vasectomy). After the procedure, the man can still ejaculate fluid (semen). Another form of birth control must be used for 3 months after the procedure. Natural planning methods Natural family planning In this method, a couple does not have sex on days  when the woman could become pregnant. Calendar method In this method, the woman keeps track of the length of each menstrual cycle, identifies the days when pregnancy can happen, and does not have sex on those days. Ovulation method In this method, a couple avoids sex during ovulation. Symptothermal method This method involves not having sex during ovulation. The woman typically checks for ovulation by watching changes in her temperature and in the consistency of cervical mucus. Post-ovulation method In this method, a couple waits to have sex until after ovulation. Where to find more information  Centers for Disease Control and Prevention: FootballExhibition.com.br Summary  Contraception, also called birth control, refers to methods or devices that prevent pregnancy.  Hormonal methods of contraception include implants, injections, pills, patches, vaginal rings, and emergency contraceptives.  Barrier methods of contraception can include female condoms, female condoms, diaphragms, cervical caps, sponges, and spermicides.  There are two types of IUDs (intrauterine devices). An IUD can be put in a woman's uterus to prevent pregnancy for 3-5 years.  Permanent sterilization can be done through a procedure for males and females. Natural family planning methods involve nothaving sex on days when the woman could become pregnant. This information is not intended to replace advice given to you  by your health care provider. Make sure you discuss any questions you have with your health care provider. Document Revised: 02/04/2020 Document Reviewed: 02/04/2020 Elsevier Patient Education  2021 Elsevier Inc.       Tdap (Tetanus, Diphtheria, Pertussis) Vaccine: What You Need to Know 1. Why get vaccinated? Tdap vaccine can prevent tetanus, diphtheria, and pertussis. Diphtheria and pertussis spread from person to person. Tetanus enters the body through cuts or wounds.  TETANUS (T) causes painful stiffening of the  muscles. Tetanus can lead to serious health problems, including being unable to open the mouth, having trouble swallowing and breathing, or death.  DIPHTHERIA (D) can lead to difficulty breathing, heart failure, paralysis, or death.  PERTUSSIS (aP), also known as "whooping cough," can cause uncontrollable, violent coughing that makes it hard to breathe, eat, or drink. Pertussis can be extremely serious especially in babies and young children, causing pneumonia, convulsions, brain damage, or death. In teens and adults, it can cause weight loss, loss of bladder control, passing out, and rib fractures from severe coughing. 2. Tdap vaccine Tdap is only for children 7 years and older, adolescents, and adults.  Adolescents should receive a single dose of Tdap, preferably at age 17 or 12 years. Pregnant people should get a dose of Tdap during every pregnancy, preferably during the early part of the third trimester, to help protect the newborn from pertussis. Infants are most at risk for severe, life-threatening complications from pertussis. Adults who have never received Tdap should get a dose of Tdap. Also, adults should receive a booster dose of either Tdap or Td (a different vaccine that protects against tetanus and diphtheria but not pertussis) every 10 years, or after 5 years in the case of a severe or dirty wound or burn. Tdap may be given at the same time as other vaccines. 3. Talk with your health care provider Tell your vaccine provider if the person getting the vaccine:  Has had an allergic reaction after a previous dose of any vaccine that protects against tetanus, diphtheria, or pertussis, or has any severe, life-threatening allergies  Has had a coma, decreased level of consciousness, or prolonged seizures within 7 days after a previous dose of any pertussis vaccine (DTP, DTaP, or Tdap)  Has seizures or another nervous system problem  Has ever had Guillain-Barr Syndrome (also called  "GBS")  Has had severe pain or swelling after a previous dose of any vaccine that protects against tetanus or diphtheria In some cases, your health care provider may decide to postpone Tdap vaccination until a future visit. People with minor illnesses, such as a cold, may be vaccinated. People who are moderately or severely ill should usually wait until they recover before getting Tdap vaccine.  Your health care provider can give you more information. 4. Risks of a vaccine reaction  Pain, redness, or swelling where the shot was given, mild fever, headache, feeling tired, and nausea, vomiting, diarrhea, or stomachache sometimes happen after Tdap vaccination. People sometimes faint after medical procedures, including vaccination. Tell your provider if you feel dizzy or have vision changes or ringing in the ears.  As with any medicine, there is a very remote chance of a vaccine causing a severe allergic reaction, other serious injury, or death. 5. What if there is a serious problem? An allergic reaction could occur after the vaccinated person leaves the clinic. If you see signs of a severe allergic reaction (hives, swelling of the face and throat, difficulty breathing, a fast heartbeat, dizziness, or  weakness), call 9-1-1 and get the person to the nearest hospital. For other signs that concern you, call your health care provider.  Adverse reactions should be reported to the Vaccine Adverse Event Reporting System (VAERS). Your health care provider will usually file this report, or you can do it yourself. Visit the VAERS website at www.vaers.LAgents.no or call (609) 507-3531. VAERS is only for reporting reactions, and VAERS staff members do not give medical advice. 6. The National Vaccine Injury Compensation Program The [employer] Vaccine Injury Compensation Program (VICP) is a federal program that was created to compensate people who may have been injured by certain vaccines. Claims regarding alleged injury  or death due to vaccination have a time limit for filing, which may be as short as two years. Visit the VICP website at SpiritualWord.at or call 365-081-8164 to learn about the program and about filing a claim. 7. How can I learn more?  Ask your health care provider.  Call your local or state health department.  Visit the website of the Food and Drug Administration (FDA) for vaccine package inserts and additional information at FinderList.no.  Contact the Centers for Disease Control and Prevention (CDC): ? Call 312-464-4106 (1-800-CDC-INFO) or ? Visit CDC's website at PicCapture.uy. Vaccine Information Statement Tdap (Tetanus, Diphtheria, Pertussis) Vaccine (04/18/2020) This information is not intended to replace advice given to you by your health care provider. Make sure you discuss any questions you have with your health care provider. Document Revised: 05/14/2020 Document Reviewed: 05/14/2020 Elsevier Patient Education  2021 Elsevier Inc.       AREA PEDIATRIC/FAMILY PRACTICE PHYSICIANS  [facility] [address] [place], [place] [address] Phone - [phone]   Fax - [phone]  JACK AMOS [address] [place], [place]  [address] Phone - [phone]   Fax - [phone]  [facility] [address] [place], [place]  [address] Phone - [phone]   Fax - [phone]  [facility] [address] [place], [place]  [address] Phone - [phone]   Fax - [phone]  [facility] [address] [place], [place]  [address] Phone - [phone]   Fax - [phone]  [facility] [address] [place], [place]  [address] Phone - [phone]   Fax - [phone]  [facility] [place] [address] [place], [place]  [address] Phone - [phone]   Fax -  [phone]  [facility] [address] [place], [address] [place], [place]  [address] Phone - [phone]   Fax - [phone]  [facility] AT [facility] [address] [place], [place]  [address] Phone - [phone]   Fax - [phone] [facility] [place] [address] [place], [place]  [address] Phone - [phone]   Fax - [phone]  [facility] [address] [place], [place]  [address] Phone - [phone]   Fax - [phone]  [facility] [address], [address] [place], [place]  [address] Phone - [phone]   Fax - [phone]  [facility] [place] [address] [place], [place]  [address] Phone - [phone]   Fax - [phone]  [facility] [address] [place], [place]  [address] Phone - [phone]  [facility] [address] [place], [place]  [address] Phone - [phone]   Fax - [phone]  [facility]  [address] [place], [place]  [address] Phone - [phone]   Fax - [phone]  [facility] [address] [place], [place]  [address] Phone - [phone]   Fax - [phone]  [facility] [address] [place], [place]  [address] Phone - [phone]   Fax - [phone]   [facility] [address] [place], [place]  [address] Phone - [phone]   Fax - [phone]  [facility] [address] [place], [place]  [address] Phone - [phone]   Fax - [phone]  DAVID RUBIN [address] [place], [place]  [address] Phone - [phone]   Fax - [phone]  [facility] [address] [place], [place]  [address] Phone - [phone]   Fax - [phone]  [facility] Al[facility] Chyle [address] [place], [place]   [address] Phone - [phone]   Fax - [phone] Gerarda Fraction [address] [place], [place]  [address] Phone - [phone]   Fax - [phone]  [facility] [address] [place], [place]  [address] Phone - [phone]   Fax - [phone]  [facility] [place] [address] [place], [place]  [address] Phone - [phone]   Fax - [phone]         Childbirth Education Options: [facility] Department Classes:  Childbirth education classes can help you get ready for a positive parenting experience. You can also meet other expectant parents and get free stuff for your baby. Each class runs for five weeks on the same night and costs $45 for the mother-to-be and her support person. Medicaid covers the cost if you are eligible. Call [phone] to register. [facility] Childbirth Education:  [phone] or [phone] or [email or web address]  Baby & Me Class: Discuss newborn & infant parenting and family adjustment issues with other new mothers in a relaxed environment. Each week brings a new speaker or baby-centered activity. We encourage new mothers to join [place] every Thursday at 11:00am. Babies birth until crawling. No registration or fee. Daddy [employer]: This course offers Dads-to-be the tools and knowledge needed to feel confident on their journey to becoming new fathers. Experienced dads, who have been trained as coaches, teach dads-to-be how to hold, comfort, diaper, swaddle and play with their infant while being able to support the new mom as well. A class for men taught by men. $25/dad Big Brother/Big Sister: Let your children share in the joy of a new brother or sister in this special class designed just for them. Class includes discussion about how families care for babies: swaddling, holding, diapering, safety as well as how they can be helpful in their new role. This class is designed for  children ages 2 to 82, but any age is welcome. Please register each child individually. $5/child  Mom Talk: This mom-led group offers support and connection to mothers as they journey through the adjustments and struggles of that sometimes overwhelming first year after the birth of a child. Tuesdays at 10:00am and Thursdays at 6:00pm. Babies welcome. No registration or fee. Breastfeeding Support Group: This group is a mother-to-mother support circle where moms have the opportunity to share their breastfeeding experiences. A Lactation Consultant is present for questions and concerns. Meets each Tuesday at 11:00am. No fee or registration. Breastfeeding Your Baby: Learn what to expect in the first days of breastfeeding your newborn.  This class will help you feel more confident with the skills needed to begin your breastfeeding experience. Many new mothers are concerned about breastfeeding after leaving the hospital. This class will also address the most common fears and challenges about breastfeeding during the first few weeks, months and beyond. (call for fee) Comfort Techniques and Tour: This 2 hour interactive class will provide you the opportunity to learn & practice hands-on techniques that can help relieve some of the discomfort of labor and encourage your baby to rotate toward the best position for birth. You and your partner will be able to try a variety of labor positions with birth balls and rebozos as well as practice breathing, relaxation, and visualization techniques. A tour of the [facility] is included with this class. $20 per registrant and support person Childbirth Class- Weekend Option: This class is a Weekend version of our Birth & Baby series. It is designed for parents who have a difficult time fitting several weeks of classes into their schedule. It covers the care of your newborn and the basics of labor and childbirth. It also includes a Maternity Care Center Tour  of [facility] and lunch. The class is held two consecutive days: beginning on Friday evening from 6:30 - 8:30 p.m. and the next day, Saturday from 9 a.m. - 4 p.m. (call for fee) Linden Dolin Class: Interested in a waterbirth?  This informational class will help you discover whether waterbirth is the right fit for you. Education about waterbirth itself, supplies you would need and how to assemble your support team is what you can expect from this class. Some obstetrical practices require this class in order to pursue a waterbirth. (Not all obstetrical practices offer waterbirth-check with your healthcare provider.) Register only the expectant mom, but you are encouraged to bring your partner to class! Required if planning waterbirth, no fee. Infant/Child CPR: Parents, grandparents, babysitters, and friends learn Cardio-Pulmonary Resuscitation skills for infants and children. You will also learn how to treat both conscious and unconscious choking in infants and children. This Family & Friends program does not offer certification. Register each participant individually to ensure that enough mannequins are available. (Call for fee) Grandparent Love: Expecting a grandbaby? This class is for you! Learn about the latest infant care and safety recommendations and ways to support your own child as he or she transitions into the parenting role. Taught by Registered Nurses who are childbirth instructors, but most importantly...they are grandmothers too! $10/person. Childbirth Class- Natural Childbirth: This series of 5 weekly classes is for expectant parents who want to learn and practice natural methods of coping with the process of labor and childbirth. Relaxation, breathing, massage, visualization, role of the partner, and helpful positioning are highlighted. Participants learn how to be confident in their body's ability to give birth. This class will empower and help parents make informed decisions about their own  care. Includes discussion that will help new parents transition into the immediate postpartum period. Maternity Care Center Tour of [facility] is included. We suggest taking this class between 25-32 weeks, but it's only a recommendation. $75 per registrant and one support person or $30 Medicaid. Childbirth Class- 3 week Series: This option of 3 weekly classes helps you and your labor partner prepare for childbirth. Newborn care, labor & birth, cesarean birth, pain management, and comfort techniques are discussed and a Maternity Care Center Tour of [facility] is included. The class meets at the same time, on the same day of  the week for 3 consecutive weeks beginning with the starting date you choose. $60 for registrant and one support person.  Marvelous Multiples: Expecting twins, triplets, or more? This class covers the differences in labor, birth, parenting, and breastfeeding issues that face multiples' parents. NICU tour is included. Led by a Certified Childbirth Educator who is the mother of twins. No fee. Caring for Baby: This class is for expectant and adoptive parents who want to learn and practice the most up-to-date newborn care for their babies. Focus is on birth through the first six weeks of life. Topics include feeding, bathing, diapering, crying, umbilical cord care, circumcision care and safe sleep. Parents learn to recognize symptoms of illness and when to call the pediatrician. Register only the mom-to-be and your partner or support person can plan to come with you! $10 per registrant and support person Childbirth Class- online option: This online class offers you the freedom to complete a Birth and Baby series in the comfort of your own home. The flexibility of this option allows you to review sections at your own pace, at times convenient to you and your support people. It includes additional video information, animations, quizzes, and extended activities. Get organized with helpful  eClass tools, checklists, and trackers. Once you register online for the class, you will receive an email within a few days to accept the invitation and begin the class when the time is right for you. The content will be available to you for 60 days. $60 for 60 days of online access for you and your support people.              Preterm Labor The normal length of a pregnancy is 39-41 weeks. Preterm labor is when labor starts before 37 completed weeks of pregnancy. Babies who are born prematurely and survive may not be fully developed and may be at an increased risk for long-term problems such as cerebral palsy, developmental delays, and vision and hearing problems. Babies who are born too early may have problems soon after birth. Problems may include regulating blood sugar, body temperature, heart rate, and breathing rate. These babies often have trouble with feeding. The risk of having problems is highest for babies who are born before 34 weeks of pregnancy. What are the causes? The exact cause of this condition is not known. What increases the risk? You are more likely to have preterm labor if you have certain risk factors that relate to your medical history, problems with present and past pregnancies, and lifestyle factors. Medical history  You have abnormalities of the uterus, including a short cervix.  You have STIs (sexually transmitted infections), or other infections of the urinary tract and the vagina.  You have chronic illnesses, such as blood clotting problems, diabetes, or high blood pressure.  You are overweight or underweight. Present and past pregnancies  You have had preterm labor before.  You are pregnant with twins or other multiples.  You have been diagnosed with a condition in which the placenta covers your cervix (placenta previa).  You waited less than 6 months between giving birth and becoming pregnant again.  Your unborn baby has some  abnormalities.  You have vaginal bleeding during pregnancy.  You became pregnant through in vitro fertilization (IVF). Lifestyle and environmental factors  You use tobacco products.  You drink alcohol.  You use street drugs.  You have stress and no social support.  You experience domestic violence.  You are exposed to certain chemicals or environmental pollutants.  Other factors  You are younger than age 7 or older than age 66. What are the signs or symptoms? Symptoms of this condition include:  Cramps similar to those that can happen during a menstrual period. The cramps may happen with diarrhea.  Pain in the abdomen or lower back.  Regular contractions that may feel like tightening of the abdomen.  A feeling of increased pressure in the pelvis.  Increased watery or bloody mucus discharge from the vagina.  Water breaking (ruptured amniotic sac). How is this diagnosed? This condition is diagnosed based on:  Your medical history and a physical exam.  A pelvic exam.  An ultrasound.  Monitoring your uterus for contractions.  Other tests, including: ? A swab of the cervix to check for a chemical called fetal fibronectin. ? Urine tests. How is this treated? Treatment for this condition depends on the length of your pregnancy, your condition, and the health of your baby. Treatment may include:  Taking medicines, such as: ? Hormone medicines. These may be given early in pregnancy to help support the pregnancy. ? Medicines to stop contractions. ? Medicines to help mature the baby's lungs. These may be prescribed if the risk of delivery is high. ? Medicines to prevent your baby from developing cerebral palsy.  Bed rest. If the labor happens before 34 weeks of pregnancy, you may need to stay in the hospital.  Delivery of the baby. Follow these instructions at home:  Do not use any products that contain nicotine or tobacco, such as cigarettes, e-cigarettes, and  chewing tobacco. If you need help quitting, ask your health care provider.  Do not drink alcohol.  Take over-the-counter and prescription medicines only as told by your health care provider.  Rest as told by your health care provider.  Return to your normal activities as told by your health care provider. Ask your health care provider what activities are safe for you.  Keep all follow-up visits as told by your health care provider. This is important.   How is this prevented? To increase your chance of having a full-term pregnancy:  Do not use street drugs or medicines that have not been prescribed to you during your pregnancy.  Talk with your health care provider before taking any herbal supplements, even if you have been taking them regularly.  Make sure you gain a healthy amount of weight during your pregnancy.  Watch for infection. If you think that you might have an infection, get it checked right away. Symptoms of infection may include: ? Fever. ? Abnormal vaginal discharge or discharge that smells bad. ? Pain or burning with urination. ? Needing to urinate urgently. ? Frequently urinating or passing small amounts of urine frequently. ? Blood in your urine. ? Urine that smells bad or unusual.  Tell your health care provider if you have had preterm labor before. Contact a health care provider if:  You think you are going into preterm labor.  You have signs or symptoms of preterm labor.  You have symptoms of infection. Get help right away if:  You are having regular, painful contractions every 5 minutes or less.  Your water breaks. Summary  Preterm labor is labor that starts before you reach 37 weeks of pregnancy.  Delivering your baby early increases your baby's risk of developing lifelong problems.  The exact cause of preterm labor is unknown. However, having an abnormal uterus, an STI (sexually transmitted infection), or vaginal bleeding during pregnancy  increases your risk for preterm  labor.  Keep all follow-up visits as told by your health care provider. This is important.  Contact a health care provider if you have signs or symptoms of preterm labor. This information is not intended to replace advice given to you by your health care provider. Make sure you discuss any questions you have with your health care provider. Document Revised: 10/02/2019 Document Reviewed: 10/02/2019 Elsevier Patient Education  2021 [employer].

## 2020-10-23 LAB — HIV ANTIBODY (ROUTINE TESTING W REFLEX): HIV Screen 4th Generation wRfx: NONREACTIVE

## 2020-10-23 LAB — CBC
Hematocrit: 33.3 % — ABNORMAL LOW (ref 34.0–46.6)
Hemoglobin: 10.6 g/dL — ABNORMAL LOW (ref 11.1–15.9)
MCH: 26.2 pg — ABNORMAL LOW (ref 26.6–33.0)
MCHC: 31.8 g/dL (ref 31.5–35.7)
MCV: 82 fL (ref 79–97)
Platelets: 155 10*3/uL (ref 150–450)
RBC: 4.05 x10E6/uL (ref 3.77–5.28)
RDW: 14.5 % (ref 11.7–15.4)
WBC: 7.5 10*3/uL (ref 3.4–10.8)

## 2020-10-23 LAB — GLUCOSE TOLERANCE, 2 HOURS W/ 1HR
Glucose, 1 hour: 112 mg/dL (ref 65–179)
Glucose, 2 hour: 93 mg/dL (ref 65–152)
Glucose, Fasting: 75 mg/dL (ref 65–91)

## 2020-10-23 LAB — RPR: RPR Ser Ql: NONREACTIVE

## 2020-10-24 ENCOUNTER — Encounter: Payer: Self-pay | Admitting: Women's Health

## 2020-10-24 ENCOUNTER — Other Ambulatory Visit: Payer: Self-pay | Admitting: Women's Health

## 2020-10-24 DIAGNOSIS — O99019 Anemia complicating pregnancy, unspecified trimester: Secondary | ICD-10-CM | POA: Insufficient documentation

## 2020-10-24 NOTE — Progress Notes (Signed)
Opened in error.  Marylen Ponto, NP  12:10 PM 10/24/2020

## 2020-11-12 ENCOUNTER — Ambulatory Visit (INDEPENDENT_AMBULATORY_CARE_PROVIDER_SITE_OTHER): Payer: 59 | Admitting: Women's Health

## 2020-11-12 ENCOUNTER — Other Ambulatory Visit: Payer: Self-pay

## 2020-11-12 VITALS — BP 118/67 | HR 92 | Wt 172.2 lb

## 2020-11-12 DIAGNOSIS — Z3400 Encounter for supervision of normal first pregnancy, unspecified trimester: Secondary | ICD-10-CM

## 2020-11-12 DIAGNOSIS — Z23 Encounter for immunization: Secondary | ICD-10-CM

## 2020-11-12 DIAGNOSIS — Z3A29 29 weeks gestation of pregnancy: Secondary | ICD-10-CM

## 2020-11-12 DIAGNOSIS — O99013 Anemia complicating pregnancy, third trimester: Secondary | ICD-10-CM

## 2020-11-12 DIAGNOSIS — D563 Thalassemia minor: Secondary | ICD-10-CM

## 2020-11-12 NOTE — Progress Notes (Signed)
Subjective:  Cassie Church is a 25 y.o. G1P0000 at [redacted]w[redacted]d being seen today for ongoing prenatal care.  She is currently monitored for the following issues for this low-risk pregnancy and has Supervision of normal pregnancy, antepartum; Alpha thalassemia silent carrier; and Anemia in pregnancy on their problem list.  Patient reports no complaints.  Contractions: Not present. Vag. Bleeding: None.  Movement: Present. Denies leaking of fluid.   The following portions of the patient's history were reviewed and updated as appropriate: allergies, current medications, past family history, past medical history, past social history, past surgical history and problem list. Problem list updated.  Objective:   Vitals:   11/12/20 1054  BP: 118/67  Pulse: 92  Weight: 172 lb 3.2 oz (78.1 kg)    Fetal Status: Fetal Heart Rate (bpm): 145 Fundal Height: 29 cm Movement: Present     General:  Alert, oriented and cooperative. Patient is in no acute distress.  Skin: Skin is warm and dry. No rash noted.   Cardiovascular: Normal heart rate noted  Respiratory: Normal respiratory effort, no problems with respiration noted  Abdomen: Soft, gravid, appropriate for gestational age. Pain/Pressure: Absent     Pelvic: Vag. Bleeding: None     Cervical exam deferred        Extremities: Normal range of motion.     Mental Status: Normal mood and affect. Normal behavior. Normal judgment and thought content.   Urinalysis:      Assessment and Plan:  Pregnancy: G1P0000 at [redacted]w[redacted]d  1. [redacted] weeks gestation of pregnancy  2. Supervision of normal first pregnancy, antepartum -flu vaccine today -Tdap today  3. Anemia during pregnancy in third trimester CBC Latest Ref Rng & Units 10/22/2020 07/02/2020  WBC 3.4 - 10.8 x10E3/uL 7.5 5.7  Hemoglobin 11.1 - 15.9 g/dL 10.6(L) 12.4  Hematocrit 34.0 - 46.6 % 33.3(L) 37.9  Platelets 150 - 450 x10E3/uL 155 198    4. Alpha thalassemia silent carrier -declined GC  Preterm labor  symptoms and general obstetric precautions including but not limited to vaginal bleeding, contractions, leaking of fluid and fetal movement were reviewed in detail with the patient. I discussed the assessment and treatment plan with the patient. The patient was provided an opportunity to ask questions and all were answered. The patient agreed with the plan and demonstrated an understanding of the instructions. The patient was advised to call back or seek an in-person office evaluation/go to [facility] at [facility] for any urgent or concerning symptoms. Please refer to After Visit Summary for other counseling recommendations.  Return in about 2 weeks (around 11/26/2020) for virtual LOB/APP OK.   Deronda Christian, Odie Sera, NP

## 2020-11-12 NOTE — Progress Notes (Signed)
Pt reports fetal movement, denies pain.  

## 2020-11-12 NOTE — Patient Instructions (Signed)
Maternity Assessment Unit (MAU)  The Maternity Assessment Unit (MAU) is located at the [facility] at [facility]. The address is: [address], [place], [place], [place] [address]. Please see map below for additional directions.    The Maternity Assessment Unit is designed to help you during your pregnancy, and for up to 6 weeks after delivery, with any pregnancy- or postpartum-related emergencies, if you think you are in labor, or if your water has broken. For example, if you experience nausea and vomiting, vaginal bleeding, severe abdominal or pelvic pain, elevated blood pressure or other problems related to your pregnancy or postpartum time, please come to the Maternity Assessment Unit for assistance.        Pregnancy and Influenza Influenza, also called the flu, is an infection of the lungs and airways (respiratory tract). If you are pregnant, you are more likely to catch the flu. You are also more likely to have serious illness from the flu. This is because pregnancy causes changes to your body's disease-fighting system (immune system), heart, and lungs. If you develop serious illness from the flu, this can cause problems for you and your developing baby. How do people get the flu? The flu is caused by a type of germ called a virus. It spreads when virus particles get passed from person to person by:  Being near a sick person who is coughing or sneezing.  Touching something that has the virus on it and then touching your mouth, nose, or face. The influenza virus is most common during the fall and winter. What actions can I take to protect myself against the flu?  Get a flu shot. The best way to prevent the flu is to get a flu shot before flu season starts. The flu shot is not dangerous for your developing baby. It may even help protect your baby from the flu for up to 6 months after birth.  Wash your hands often with soap and warm water for at least  20 seconds. If soap and water are not available, use alcohol-based hand sanitizer.  Do not come in close contact with sick people.  Do not share food, drinks, or utensils with other people.  Avoid touching your eyes, nose, and mouth.  Clean frequently used surfaces at home, school, or work.  Practice healthy lifestyle habits, such as: ? Eating a healthy, balanced diet. ? Drinking plenty of fluids. ? Exercising regularly or as told by your health care provider. ? Sleeping 7-9 hours each night. ? Finding ways to manage stress.   What should I do if I have flu symptoms?  If you have any symptoms of the flu, even after getting a flu shot, contact your health care provider right away.  To reduce fever, take over-the-counter acetaminophen as told by your health care provider.  If you have the flu, your health care provider may give you antiviral medicine to keep the flu from becoming severe and to shorten how long it lasts.  Avoid spreading the flu to others: ? Stay home until you are well. ? Cover your nose and mouth when you cough or sneeze. ? Wash your hands often.   Follow these instructions at home:  Take over-the-counter and prescription medicines only as told by your health care provider. Do not take any medicine, including cold or flu medicine, unless your health care provider tells you to do so.  If you were prescribed antiviral medicine, take it as told by your health  care provider. Do not stop taking the antiviral medicine even if you start to feel better.  Eat a nutrient-rich diet that includes fresh fruits and vegetables, whole grains, lean protein, and low-fat dairy.  Drink enough fluid to keep your urine clear or pale yellow.  Get plenty of rest.  Keep all follow-up visits. This is important. Contact a health care provider if:  You have a fever or chills.  You have a cough, sore throat, or stuffy nose.  You have worsening or unusual muscle aches, headache,  tiredness, or loss of appetite.  You have vomiting or diarrhea. Get help right away if:  You have trouble breathing.  You have chest pain.  You have abdominal pain.  You begin to have labor pains.  You do not feel your baby move.  You have diarrhea or vomiting that will not go away.  You have dizziness or confusion.  Your symptoms do not improve, even with treatment. These symptoms may represent a serious problem that is an emergency. Do not wait to see if the symptoms will go away. Get medical help right away. Call your local emergency services (911 in the [place] Do not drive yourself to the hospital. Summary  If you are pregnant, you are more likely to catch the flu. You are also more likely to have a more serious case of the flu.  If you have flu-like symptoms, call your health care provider right away. If you develop serious illness from the flu, this can cause problems for you and your developing baby.  The best way to prevent the flu is to get a flu shot before flu season starts. The flu shot is safe during pregnancy and not dangerous for your developing baby.  If you have the flu and were prescribed antiviral medicine, take it as told by your health care provider. This information is not intended to replace advice given to you by your health care provider. Make sure you discuss any questions you have with your health care provider. Document Revised: 04/19/2020 Document Reviewed: 04/19/2020 Elsevier Patient Education  2021 Elsevier Inc.       Tdap (Tetanus, Diphtheria, Pertussis) Vaccine: What You Need to Know 1. Why get vaccinated? Tdap vaccine can prevent tetanus, diphtheria, and pertussis. Diphtheria and pertussis spread from person to person. Tetanus enters the body through cuts or wounds.  TETANUS (T) causes painful stiffening of the muscles. Tetanus can lead to serious health problems, including being unable to open the mouth, having trouble swallowing and  breathing, or death.  DIPHTHERIA (D) can lead to difficulty breathing, heart failure, paralysis, or death.  PERTUSSIS (aP), also known as "whooping cough," can cause uncontrollable, violent coughing that makes it hard to breathe, eat, or drink. Pertussis can be extremely serious especially in babies and young children, causing pneumonia, convulsions, brain damage, or death. In teens and adults, it can cause weight loss, loss of bladder control, passing out, and rib fractures from severe coughing. 2. Tdap vaccine Tdap is only for children 7 years and older, adolescents, and adults.  Adolescents should receive a single dose of Tdap, preferably at age 71 or 12 years. Pregnant people should get a dose of Tdap during every pregnancy, preferably during the early part of the third trimester, to help protect the newborn from pertussis. Infants are most at risk for severe, life-threatening complications from pertussis. Adults who have never received Tdap should get a dose of Tdap. Also, adults should receive a booster dose of either Tdap  or Td (a different vaccine that protects against tetanus and diphtheria but not pertussis) every 10 years, or after 5 years in the case of a severe or dirty wound or burn. Tdap may be given at the same time as other vaccines. 3. Talk with your health care provider Tell your vaccine provider if the person getting the vaccine:  Has had an allergic reaction after a previous dose of any vaccine that protects against tetanus, diphtheria, or pertussis, or has any severe, life-threatening allergies  Has had a coma, decreased level of consciousness, or prolonged seizures within 7 days after a previous dose of any pertussis vaccine (DTP, DTaP, or Tdap)  Has seizures or another nervous system problem  Has ever had Guillain-Barr Syndrome (also called "GBS")  Has had severe pain or swelling after a previous dose of any vaccine that protects against tetanus or diphtheria In some  cases, your health care provider may decide to postpone Tdap vaccination until a future visit. People with minor illnesses, such as a cold, may be vaccinated. People who are moderately or severely ill should usually wait until they recover before getting Tdap vaccine.  Your health care provider can give you more information. 4. Risks of a vaccine reaction  Pain, redness, or swelling where the shot was given, mild fever, headache, feeling tired, and nausea, vomiting, diarrhea, or stomachache sometimes happen after Tdap vaccination. People sometimes faint after medical procedures, including vaccination. Tell your provider if you feel dizzy or have vision changes or ringing in the ears.  As with any medicine, there is a very remote chance of a vaccine causing a severe allergic reaction, other serious injury, or death. 5. What if there is a serious problem? An allergic reaction could occur after the vaccinated person leaves the clinic. If you see signs of a severe allergic reaction (hives, swelling of the face and throat, difficulty breathing, a fast heartbeat, dizziness, or weakness), call 9-1-1 and get the person to the nearest hospital. For other signs that concern you, call your health care provider.  Adverse reactions should be reported to the Vaccine Adverse Event Reporting System (VAERS). Your health care provider will usually file this report, or you can do it yourself. Visit the VAERS website at www.vaers.LAgents.nohhs.gov or call (423)637-48201-(480) 535-1125. VAERS is only for reporting reactions, and VAERS staff members do not give medical advice. 6. The National Vaccine Injury Compensation Program The [employer] Vaccine Injury Compensation Program (VICP) is a federal program that was created to compensate people who may have been injured by certain vaccines. Claims regarding alleged injury or death due to vaccination have a time limit for filing, which may be as short as two years. Visit the VICP website at  SpiritualWord.atwww.hrsa.gov/vaccinecompensation or call (804)400-51991-(218) 469-1585 to learn about the program and about filing a claim. 7. How can I learn more?  Ask your health care provider.  Call your local or state health department.  Visit the website of the Food and Drug Administration (FDA) for vaccine package inserts and additional information at FinderList.nowww.fda.gov/vaccines-blood-biologics/vaccines.  Contact the Centers for Disease Control and Prevention (CDC): ? Call 856-388-77691-406 321 3686 (1-800-CDC-INFO) or ? Visit CDC's website at PicCapture.uywww.cdc.gov/vaccines. Vaccine Information Statement Tdap (Tetanus, Diphtheria, Pertussis) Vaccine (04/18/2020) This information is not intended to replace advice given to you by your health care provider. Make sure you discuss any questions you have with your health care provider. Document Revised: 05/14/2020 Document Reviewed: 05/14/2020 Elsevier Patient Education  2021 Elsevier Inc.        Kn[place]villeWebhost.czhttps://www.nichd.nih.gov/health/topics/labor-delivery/Pages/default.aspx">  Third Trimester of Pregnancy  The third trimester of pregnancy is from week 28 through week 40. This is months 7 through 9. The third trimester is a time when the unborn baby (fetus) is growing rapidly. At the end of the ninth month, the fetus is about 20 inches long and weighs 6-10 pounds. Body changes during your third trimester During the third trimester, your body will continue to go through many changes. The changes vary and generally return to normal after your baby is born. Physical changes  Your weight will continue to increase. You can expect to gain 25-35 pounds (11-16 kg) by the end of the pregnancy if you begin pregnancy at a normal weight. If you are underweight, you can expect to gain 28-40 lb (about 13-18 kg), and if you are overweight, you can expect to gain 15-25 lb (about 7-11 kg).  You may begin to get stretch marks on your hips, abdomen, and breasts.  Your breasts will continue to grow and may hurt. A  yellow fluid (colostrum) may leak from your breasts. This is the first milk you are producing for your baby.  You may have changes in your hair. These can include thickening of your hair, rapid growth, and changes in texture. Some people also have hair loss during or after pregnancy, or hair that feels dry or thin.  Your belly button may stick out.  You may notice more swelling in your hands, face, or ankles. Health changes  You may have heartburn.  You may have constipation.  You may develop hemorrhoids.  You may develop swollen, bulging veins (varicose veins) in your legs.  You may have increased body aches in the pelvis, back, or thighs. This is due to weight gain and increased hormones that are relaxing your joints.  You may have increased tingling or numbness in your hands, arms, and legs. The skin on your abdomen may also feel numb.  You may feel short of breath because of your expanding uterus. Other changes  You may urinate more often because the fetus is moving lower into your pelvis and pressing on your bladder.  You may have more problems sleeping. This may be caused by the size of your abdomen, an increased need to urinate, and an increase in your body's metabolism.  You may notice the fetus "dropping," or moving lower in your abdomen (lightening).  You may have increased vaginal discharge.  You may notice that you have pain around your pelvic bone as your uterus distends. Follow these instructions at home: Medicines  Follow your health care provider's instructions regarding medicine use. Specific medicines may be either safe or unsafe to take during pregnancy. Do not take any medicines unless approved by your health care provider.  Take a prenatal vitamin that contains at least 600 micrograms (mcg) of folic acid. Eating and drinking  Eat a healthy diet that includes fresh fruits and vegetables, whole grains, good sources of protein such as meat, eggs, or tofu,  and low-fat dairy products.  Avoid raw meat and unpasteurized juice, milk, and cheese. These carry germs that can harm you and your baby.  Eat 4 or 5 small meals rather than 3 large meals a day.  You may need to take these actions to prevent or treat constipation: ? Drink enough fluid to keep your urine pale yellow. ? Eat foods that are high in fiber, such as beans, whole grains, and fresh fruits and vegetables. ? Limit foods that are high in fat and processed  sugars, such as fried or sweet foods. Activity  Exercise only as directed by your health care provider. Most people can continue their usual exercise routine during pregnancy. Try to exercise for 30 minutes at least 5 days a week. Stop exercising if you experience contractions in the uterus.  Stop exercising if you develop pain or cramping in the lower abdomen or lower back.  Avoid heavy lifting.  Do not exercise if it is very hot or humid or if you are at a high altitude.  If you choose to, you may continue to have sex unless your health care provider tells you not to. Relieving pain and discomfort  Take frequent breaks and rest with your legs raised (elevated) if you have leg cramps or low back pain.  Take warm sitz baths to soothe any pain or discomfort caused by hemorrhoids. Use hemorrhoid cream if your health care provider approves.  Wear a supportive bra to prevent discomfort from breast tenderness.  If you develop varicose veins: ? Wear support hose as told by your health care provider. ? Elevate your feet for 15 minutes, 3-4 times a day. ? Limit salt in your diet. Safety  Talk to your health care provider before traveling far distances.  Do not use hot tubs, steam rooms, or saunas.  Wear your seat belt at all times when driving or riding in a car.  Talk with your health care provider if someone is verbally or physically abusive to you. Preparing for birth To prepare for the arrival of your baby:  Take  prenatal classes to understand, practice, and ask questions about labor and delivery.  Visit the hospital and tour the maternity area.  Purchase a rear-facing car seat and make sure you know how to install it in your car.  Prepare the baby's room or sleeping area. Make sure to remove all pillows and stuffed animals from the baby's crib to prevent suffocation. General instructions  Avoid cat litter boxes and soil used by cats. These carry germs that can cause birth defects in the baby. If you have a cat, ask someone to clean the litter box for you.  Do not douche or use tampons. Do not use scented sanitary pads.  Do not use any products that contain nicotine or tobacco, such as cigarettes, e-cigarettes, and chewing tobacco. If you need help quitting, ask your health care provider.  Do not use any herbal remedies, illegal drugs, or medicines that were not prescribed to you. Chemicals in these products can harm your baby.  Do not drink alcohol.  You will have more frequent prenatal exams during the third trimester. During a routine prenatal visit, your health care provider will do a physical exam, perform tests, and discuss your overall health. Keep all follow-up visits. This is important. Where to find more information  American Pregnancy Association: americanpregnancy.org  [employer]: https://www.todd-brady.net/  Office on [employer] Health: MightyReward.co.nz Contact a health care provider if you have:  A fever.  Mild pelvic cramps, pelvic pressure, or nagging pain in your abdominal area or lower back.  Vomiting or diarrhea.  Bad-smelling vaginal discharge or foul-smelling urine.  Pain when you urinate.  A headache that does not go away when you take medicine.  Visual changes or see spots in front of your eyes. Get help right away if:  Your water breaks.  You have regular contractions less than 5 minutes  apart.  You have spotting or bleeding from your vagina.  You have  severe abdominal pain.  You have difficulty breathing.  You have chest pain.  You have fainting spells.  You have not felt your baby move for the time period told by your health care provider.  You have new or increased pain, swelling, or redness in an arm or leg. Summary  The third trimester of pregnancy is from week 28 through week 40 (months 7 through 9).  You may have more problems sleeping. This can be caused by the size of your abdomen, an increased need to urinate, and an increase in your body's metabolism.  You will have more frequent prenatal exams during the third trimester. Keep all follow-up visits. This is important. This information is not intended to replace advice given to you by your health care provider. Make sure you discuss any questions you have with your health care provider. Document Revised: 02/06/2020 Document Reviewed: 12/13/2019 Elsevier Patient Education  2021 [employer].

## 2020-11-26 ENCOUNTER — Other Ambulatory Visit: Payer: Self-pay

## 2020-11-26 ENCOUNTER — Ambulatory Visit (INDEPENDENT_AMBULATORY_CARE_PROVIDER_SITE_OTHER): Payer: 59 | Admitting: Women's Health

## 2020-11-26 ENCOUNTER — Other Ambulatory Visit (HOSPITAL_COMMUNITY)
Admission: RE | Admit: 2020-11-26 | Discharge: 2020-11-26 | Disposition: A | Discharge status: Home / Self Care | Payer: 59 | Source: Ambulatory Visit | Attending: Women's Health | Admitting: Women's Health

## 2020-11-26 VITALS — BP 124/72 | HR 96 | Wt 172.0 lb

## 2020-11-26 DIAGNOSIS — Z3400 Encounter for supervision of normal first pregnancy, unspecified trimester: Secondary | ICD-10-CM

## 2020-11-26 DIAGNOSIS — N898 Other specified noninflammatory disorders of vagina: Secondary | ICD-10-CM | POA: Insufficient documentation

## 2020-11-26 DIAGNOSIS — Z3A31 31 weeks gestation of pregnancy: Secondary | ICD-10-CM

## 2020-11-26 DIAGNOSIS — D563 Thalassemia minor: Secondary | ICD-10-CM

## 2020-11-26 DIAGNOSIS — O99013 Anemia complicating pregnancy, third trimester: Secondary | ICD-10-CM

## 2020-11-26 MED ORDER — TERCONAZOLE 0.4 % VA CREA: 1.0000 | TOPICAL_CREAM | Freq: Every day | VAGINAL | 0 refills | Status: AC

## 2020-11-26 NOTE — Patient Instructions (Signed)
Maternity Assessment Unit (MAU)  The Maternity Assessment Unit (MAU) is located at the [facility] and [place] [facility]. The address is: [address], [place], [place], [place] [address]. Please see map below for additional directions.    The Maternity Assessment Unit is designed to help you during your pregnancy, and for up to 6 weeks after delivery, with any pregnancy- or postpartum-related emergencies, if you think you are in labor, or if your water has broken. For example, if you experience nausea and vomiting, vaginal bleeding, severe abdominal or pelvic pain, elevated blood pressure or other problems related to your pregnancy or postpartum time, please come to the Maternity Assessment Unit for assistance.        Preterm Labor The normal length of a pregnancy is 39-41 weeks. Preterm labor is when labor starts before 37 completed weeks of pregnancy. Babies who are born prematurely and survive may not be fully developed and may be at an increased risk for long-term problems such as cerebral palsy, developmental delays, and vision and hearing problems. Babies who are born too early may have problems soon after birth. Problems may include regulating blood sugar, body temperature, heart rate, and breathing rate. These babies often have trouble with feeding. The risk of having problems is highest for babies who are born before 44 weeks of pregnancy. What are the causes? The exact cause of this condition is not known. What increases the risk? You are more likely to have preterm labor if you have certain risk factors that relate to your medical history, problems with present and past pregnancies, and lifestyle factors. Medical history  You have abnormalities of the uterus, including a short cervix.  You have STIs (sexually transmitted infections), or other infections of the urinary tract and the vagina.  You have chronic illnesses, such as blood clotting problems,  diabetes, or high blood pressure.  You are overweight or underweight. Present and past pregnancies  You have had preterm labor before.  You are pregnant with twins or other multiples.  You have been diagnosed with a condition in which the placenta covers your cervix (placenta previa).  You waited less than 6 months between giving birth and becoming pregnant again.  Your unborn baby has some abnormalities.  You have vaginal bleeding during pregnancy.  You became pregnant through in vitro fertilization (IVF). Lifestyle and environmental factors  You use tobacco products.  You drink alcohol.  You use street drugs.  You have stress and no social support.  You experience domestic violence.  You are exposed to certain chemicals or environmental pollutants. Other factors  You are younger than age 110 or older than age 19. What are the signs or symptoms? Symptoms of this condition include:  Cramps similar to those that can happen during a menstrual period. The cramps may happen with diarrhea.  Pain in the abdomen or lower back.  Regular contractions that may feel like tightening of the abdomen.  A feeling of increased pressure in the pelvis.  Increased watery or bloody mucus discharge from the vagina.  Water breaking (ruptured amniotic sac). How is this diagnosed? This condition is diagnosed based on:  Your medical history and a physical exam.  A pelvic exam.  An ultrasound.  Monitoring your uterus for contractions.  Other tests, including: ? A swab of the cervix to check for a chemical called fetal fibronectin. ? Urine tests. How is this treated? Treatment for this condition depends on the length of your pregnancy, your  condition, and the health of your baby. Treatment may include:  Taking medicines, such as: ? Hormone medicines. These may be given early in pregnancy to help support the pregnancy. ? Medicines to stop contractions. ? Medicines to help mature  the baby's lungs. These may be prescribed if the risk of delivery is high. ? Medicines to prevent your baby from developing cerebral palsy.  Bed rest. If the labor happens before 34 weeks of pregnancy, you may need to stay in the hospital.  Delivery of the baby. Follow these instructions at home:  Do not use any products that contain nicotine or tobacco, such as cigarettes, e-cigarettes, and chewing tobacco. If you need help quitting, ask your health care provider.  Do not drink alcohol.  Take over-the-counter and prescription medicines only as told by your health care provider.  Rest as told by your health care provider.  Return to your normal activities as told by your health care provider. Ask your health care provider what activities are safe for you.  Keep all follow-up visits as told by your health care provider. This is important.   How is this prevented? To increase your chance of having a full-term pregnancy:  Do not use street drugs or medicines that have not been prescribed to you during your pregnancy.  Talk with your health care provider before taking any herbal supplements, even if you have been taking them regularly.  Make sure you gain a healthy amount of weight during your pregnancy.  Watch for infection. If you think that you might have an infection, get it checked right away. Symptoms of infection may include: ? Fever. ? Abnormal vaginal discharge or discharge that smells bad. ? Pain or burning with urination. ? Needing to urinate urgently. ? Frequently urinating or passing small amounts of urine frequently. ? Blood in your urine. ? Urine that smells bad or unusual.  Tell your health care provider if you have had preterm labor before. Contact a health care provider if:  You think you are going into preterm labor.  You have signs or symptoms of preterm labor.  You have symptoms of infection. Get help right away if:  You are having regular, painful  contractions every 5 minutes or less.  Your water breaks. Summary  Preterm labor is labor that starts before you reach 37 weeks of pregnancy.  Delivering your baby early increases your baby's risk of developing lifelong problems.  The exact cause of preterm labor is unknown. However, having an abnormal uterus, an STI (sexually transmitted infection), or vaginal bleeding during pregnancy increases your risk for preterm labor.  Keep all follow-up visits as told by your health care provider. This is important.  Contact a health care provider if you have signs or symptoms of preterm labor. This information is not intended to replace advice given to you by your health care provider. Make sure you discuss any questions you have with your health care provider. Document Revised: 10/02/2019 Document Reviewed: 10/02/2019 Elsevier Patient Education  2021 Elsevier Inc.        Vaginal Yeast Infection, Adult  Vaginal yeast infection is a condition that causes vaginal discharge as well as soreness, swelling, and redness (inflammation) of the vagina. This is a common condition. Some women get this infection frequently. What are the causes? This condition is caused by a change in the normal balance of the yeast (candida) and bacteria that live in the vagina. This change causes an overgrowth of yeast, which causes the inflammation. What  increases the risk? The condition is more likely to develop in women who:  Take antibiotic medicines.  Have diabetes.  Take birth control pills.  Are pregnant.  Douche often.  Have a weak body defense system (immune system).  Have been taking steroid medicines for a long time.  Frequently wear tight clothing. What are the signs or symptoms? Symptoms of this condition include:  White, thick, creamy vaginal discharge.  Swelling, itching, redness, and irritation of the vagina. The lips of the vagina (vulva) may be affected as well.  Pain or a burning  feeling while urinating.  Pain during sex. How is this diagnosed? This condition is diagnosed based on:  Your medical history.  A physical exam.  A pelvic exam. Your health care provider will examine a sample of your vaginal discharge under a microscope. Your health care provider may send this sample for testing to confirm the diagnosis. How is this treated? This condition is treated with medicine. Medicines may be over-the-counter or prescription. You may be told to use one or more of the following:  Medicine that is taken by mouth (orally).  Medicine that is applied as a cream (topically).  Medicine that is inserted directly into the vagina (suppository). Follow these instructions at home: Lifestyle  Do not have sex until your health care provider approves. Tell your sex partner that you have a yeast infection. That person should go to his or her health care provider and ask if they should also be treated.  Do not wear tight clothes, such as pantyhose or tight pants.  Wear breathable cotton underwear. General instructions  Take or apply over-the-counter and prescription medicines only as told by your health care provider.  Eat more yogurt. This may help to keep your yeast infection from returning.  Do not use tampons until your health care provider approves.  Try taking a sitz bath to help with discomfort. This is a warm water bath that is taken while you are sitting down. The water should only come up to your hips and should cover your buttocks. Do this 3-4 times per day or as told by your health care provider.  Do not douche.  If you have diabetes, keep your blood sugar levels under control.  Keep all follow-up visits as told by your health care provider. This is important.   Contact a health care provider if:  You have a fever.  Your symptoms go away and then return.  Your symptoms do not get better with treatment.  Your symptoms get worse.  You have new  symptoms.  You develop blisters in or around your vagina.  You have blood coming from your vagina and it is not your menstrual period.  You develop pain in your abdomen. Summary  Vaginal yeast infection is a condition that causes discharge as well as soreness, swelling, and redness (inflammation) of the vagina.  This condition is treated with medicine. Medicines may be over-the-counter or prescription.  Take or apply over-the-counter and prescription medicines only as told by your health care provider.  Do not douche. Do not have sex or use tampons until your health care provider approves.  Contact a health care provider if your symptoms do not get better with treatment or your symptoms go away and then return. This information is not intended to replace advice given to you by your health care provider. Make sure you discuss any questions you have with your health care provider. Document Revised: 03/30/2019 Document Reviewed: 01/16/2018 Elsevier Patient  Education  2021 Elsevier Inc.  

## 2020-11-26 NOTE — Progress Notes (Signed)
Subjective:  Cassie Church is a 25 y.o. G1P0000 at [redacted]w[redacted]d being seen today for ongoing prenatal care.  She is currently monitored for the following issues for this low-risk pregnancy and has Supervision of normal pregnancy, antepartum; Alpha thalassemia silent carrier; and Anemia in pregnancy on their problem list.  Patient reports vaginal irritation.  Contractions: Not present. Vag. Bleeding: None.  Movement: Present. Denies leaking of fluid.   The following portions of the patient's history were reviewed and updated as appropriate: allergies, current medications, past family history, past medical history, past social history, past surgical history and problem list. Problem list updated.  Objective:   Vitals:   11/26/20 1041  BP: 124/72  Pulse: 96  Weight: 172 lb (78 kg)    Fetal Status: Fetal Heart Rate (bpm): 138   Movement: Present     General:  Alert, oriented and cooperative. Patient is in no acute distress.  Skin: Skin is warm and dry. No rash noted.   Cardiovascular: Normal heart rate noted  Respiratory: Normal respiratory effort, no problems with respiration noted  Abdomen: Soft, gravid, appropriate for gestational age. Pain/Pressure: Absent     Pelvic: Vag. Bleeding: None     Cervical exam deferred        Extremities: Normal range of motion.  Edema: Trace  Mental Status: Normal mood and affect. Normal behavior. Normal judgment and thought content.   Urinalysis:      Assessment and Plan:  Pregnancy: G1P0000 at [redacted]w[redacted]d  1. Vaginal discharge -vaginal self swab performed -RX terconazole  2. [redacted] weeks gestation of pregnancy  3. Supervision of normal first pregnancy, antepartum  4. Alpha thalassemia silent carrier -declined genetic counseling  5. Anemia during pregnancy in third trimester -pt has not yet picked up liquid iron, discussed Floradex and other liquid supplements, pt will pick up  Preterm labor symptoms and general obstetric precautions including but not  limited to vaginal bleeding, contractions, leaking of fluid and fetal movement were reviewed in detail with the patient. I discussed the assessment and treatment plan with the patient. The patient was provided an opportunity to ask questions and all were answered. The patient agreed with the plan and demonstrated an understanding of the instructions. The patient was advised to call back or seek an in-person office evaluation/go to MAU at [facility] for any urgent or concerning symptoms. Please refer to After Visit Summary for other counseling recommendations.  Return in about 2 weeks (around 12/10/2020) for virtual LOB/APP OK.   Mikisha Roseland, Odie Sera, NP

## 2020-11-26 NOTE — Progress Notes (Signed)
+   Fetal movement. Pt c/o vaginal irritation and itching for approximately 1 week.

## 2020-11-27 LAB — CERVICOVAGINAL ANCILLARY ONLY
Candida Glabrata: NEGATIVE
Candida Vaginitis: POSITIVE — AB
Chlamydia: NEGATIVE
Comment: NEGATIVE
Comment: NEGATIVE
Comment: NEGATIVE
Comment: NEGATIVE
Comment: NORMAL
Neisseria Gonorrhea: NEGATIVE
Trichomonas: NEGATIVE

## 2020-12-10 ENCOUNTER — Encounter: Payer: Self-pay | Admitting: Obstetrics

## 2020-12-10 ENCOUNTER — Telehealth (INDEPENDENT_AMBULATORY_CARE_PROVIDER_SITE_OTHER): Payer: 59 | Admitting: Obstetrics

## 2020-12-10 VITALS — BP 118/65 | HR 92 | Wt 172.0 lb

## 2020-12-10 DIAGNOSIS — O99013 Anemia complicating pregnancy, third trimester: Secondary | ICD-10-CM

## 2020-12-10 DIAGNOSIS — Z3400 Encounter for supervision of normal first pregnancy, unspecified trimester: Secondary | ICD-10-CM

## 2020-12-10 DIAGNOSIS — Z3A33 33 weeks gestation of pregnancy: Secondary | ICD-10-CM

## 2020-12-10 DIAGNOSIS — D563 Thalassemia minor: Secondary | ICD-10-CM

## 2020-12-10 NOTE — Progress Notes (Signed)
   OBSTETRICS PRENATAL VIRTUAL VISIT ENCOUNTER NOTE  [facility]   Patient location: Home  I connected with Cassie Church on 12/10/20 at 10:30 AM EDT by MyChart Video Encounter and verified that I am speaking with the correct person using two identifiers.   I discussed the limitations, risks, security and privacy concerns of performing an evaluation and management service virtually and the availability of in person appointments. I also discussed with the patient that there may be a patient responsible charge related to this service. The patient expressed understanding and agreed to proceed. Subjective:  Cassie Church is a 25 y.o. G1P0000 at [redacted]w[redacted]d being seen today for ongoing prenatal care.  She is currently monitored for the following issues for this low-risk pregnancy and has Supervision of normal pregnancy, antepartum; Alpha thalassemia silent carrier; and Anemia in pregnancy on their problem list.  Patient reports no complaints.  Contractions: Not present. Vag. Bleeding: None.  Movement: Present. Denies any leaking of fluid.   The following portions of the patient's history were reviewed and updated as appropriate: allergies, current medications, past family history, past medical history, past social history, past surgical history and problem list.   Objective:   Vitals:   12/10/20 1010  BP: 118/65  Pulse: 92  Weight: 172 lb (78 kg)    Fetal Status:     Movement: Present     General:  Alert, oriented and cooperative. Patient is in no acute distress.  Respiratory: Normal respiratory effort, no problems with respiration noted  Mental Status: Normal mood and affect. Normal behavior. Normal judgment and thought content.  Rest of physical exam deferred due to type of encounter  Imaging: No results found.  Assessment and Plan:  Pregnancy: G1P0000 at [redacted]w[redacted]d  1. Supervision of normal first pregnancy, antepartum  2. Anemia during  pregnancy in third trimester  3. Alpha thalassemia silent carrier   Preterm labor symptoms and general obstetric precautions including but not limited to vaginal bleeding, contractions, leaking of fluid and fetal movement were reviewed in detail with the patient. I discussed the assessment and treatment plan with the patient. The patient was provided an opportunity to ask questions and all were answered. The patient agreed with the plan and demonstrated an understanding of the instructions. The patient was advised to call back or seek an in-person office evaluation/go to MAU at [facility] for any urgent or concerning symptoms. Please refer to After Visit Summary for other counseling recommendations.   I provided 15 minutes of face-to-face time during this encounter.  Return in about 2 weeks (around 12/24/2020) for ROB.  Future Appointments  Date Time Provider Department Center  12/10/2020 10:30 AM Brock Bad, MD [facility] None    Coral Ceo, MD [facility] 12/10/20

## 2020-12-24 ENCOUNTER — Ambulatory Visit (INDEPENDENT_AMBULATORY_CARE_PROVIDER_SITE_OTHER): Payer: 59 | Admitting: Women's Health

## 2020-12-24 ENCOUNTER — Other Ambulatory Visit: Payer: Self-pay

## 2020-12-24 VITALS — BP 115/74 | HR 86 | Wt 179.0 lb

## 2020-12-24 DIAGNOSIS — Z3400 Encounter for supervision of normal first pregnancy, unspecified trimester: Secondary | ICD-10-CM

## 2020-12-24 DIAGNOSIS — D563 Thalassemia minor: Secondary | ICD-10-CM

## 2020-12-24 DIAGNOSIS — O99013 Anemia complicating pregnancy, third trimester: Secondary | ICD-10-CM

## 2020-12-24 DIAGNOSIS — Z3A35 35 weeks gestation of pregnancy: Secondary | ICD-10-CM

## 2020-12-24 NOTE — Progress Notes (Signed)
Subjective:  Cassie Church is a 25 y.o. G1P0000 at [redacted]w[redacted]d being seen today for ongoing prenatal care.  She is currently monitored for the following issues for this low-risk pregnancy and has Supervision of normal pregnancy, antepartum; Alpha thalassemia silent carrier; and Anemia in pregnancy on their problem list.  Patient reports no complaints.  Contractions: Not present. Vag. Bleeding: None.  Movement: Present. Denies leaking of fluid.   The following portions of the patient's history were reviewed and updated as appropriate: allergies, current medications, past family history, past medical history, past social history, past surgical history and problem list. Problem list updated.  Objective:   Vitals:   12/24/20 1330  BP: 115/74  Pulse: 86  Weight: 179 lb (81.2 kg)    Fetal Status: Fetal Heart Rate (bpm): 158 Fundal Height: 36 cm Movement: Present     General:  Alert, oriented and cooperative. Patient is in no acute distress.  Skin: Skin is warm and dry. No rash noted.   Cardiovascular: Normal heart rate noted  Respiratory: Normal respiratory effort, no problems with respiration noted  Abdomen: Soft, gravid, appropriate for gestational age. Pain/Pressure: Absent     Pelvic: Vag. Bleeding: None     Cervical exam deferred        Extremities: Normal range of motion.  Edema: Trace  Mental Status: Normal mood and affect. Normal behavior. Normal judgment and thought content.   Urinalysis:      Assessment and Plan:  Pregnancy: G1P0000 at [redacted]w[redacted]d  1. Supervision of normal first pregnancy, antepartum -GBS/swabs next visit  2. Alpha thalassemia silent carrier -declined GC  3. Anemia during pregnancy in third trimester -pt has not yet picked up liquid iron, pt reports cannot swallow pills -repeat CBC today CBC Latest Ref Rng & Units 10/22/2020 07/02/2020  WBC 3.4 - 10.8 x10E3/uL 7.5 5.7  Hemoglobin 11.1 - 15.9 g/dL 10.6(L) 12.4  Hematocrit 34.0 - 46.6 % 33.3(L) 37.9  Platelets 150 -  450 x10E3/uL 155 198   4. [redacted] weeks gestation of pregnancy   Preterm labor symptoms and general obstetric precautions including but not limited to vaginal bleeding, contractions, leaking of fluid and fetal movement were reviewed in detail with the patient. I discussed the assessment and treatment plan with the patient. The patient was provided an opportunity to ask questions and all were answered. The patient agreed with the plan and demonstrated an understanding of the instructions. The patient was advised to call back or seek an in-person office evaluation/go to MAU at [facility] for any urgent or concerning symptoms. Please refer to After Visit Summary for other counseling recommendations.  Return in about 1 week (around 12/31/2020) for in-person LOB/APP OK/GBS/GC/CT.   Joanna Hall, Odie Sera, NP

## 2020-12-24 NOTE — Progress Notes (Signed)
ROB [redacted]w[redacted]d ° °CC: None  ° ° °

## 2020-12-24 NOTE — Patient Instructions (Signed)
Maternity Assessment Unit (MAU)  The Maternity Assessment Unit (MAU) is located at the [facility] at [facility]. The address is: [address], [place], [place], [place] [address]. Please see map below for additional directions.    The Maternity Assessment Unit is designed to help you during your pregnancy, and for up to 6 weeks after delivery, with any pregnancy- or postpartum-related emergencies, if you think you are in labor, or if your water has broken. For example, if you experience nausea and vomiting, vaginal bleeding, severe abdominal or pelvic pain, elevated blood pressure or other problems related to your pregnancy or postpartum time, please come to the Maternity Assessment Unit for assistance.        Group B Streptococcus Test During Pregnancy Why am I having this test? Routine testing, also called screening, for group B streptococcus (GBS) is recommended for all pregnant women between the 36th and 37th week of pregnancy. GBS is a type of bacteria that can be passed from mother to baby during childbirth. Screening will help guide whether or not you will need treatment during labor and delivery to prevent complications such as:  An infection in your uterus during labor.  An infection in your uterus after delivery.  A serious infection in your baby after delivery, such as pneumonia, meningitis, or sepsis. GBS screening is not often done before 36 weeks of pregnancy unless you go into labor prematurely. What happens if I have group B streptococcus? If testing shows that you have GBS, your health care provider will recommend treatment with IV antibiotics during labor and delivery. This treatment significantly decreases the risk of complications for you and your baby. If you have a planned C-section and you have GBS, you may not need to be treated with antibiotics because GBS is usually passed to babies after labor starts and your water breaks. If  you are in labor or your water breaks before your C-section, it is possible for GBS to get into your uterus and be passed to your baby, so you might need treatment. Is there a chance I may not need to be tested? You may not need to be tested for GBS if:  You have a urine test that shows GBS before 36 to 37 weeks.  You had a baby with GBS infection after a previous delivery. In these cases, you will automatically be treated for GBS during labor and delivery. What is being tested? This test is done to check if you have group B streptococcus in your vagina or rectum. What kind of sample is taken? To collect samples for this test, your health care provider will swab your vagina and rectum with a cotton swab. The sample is then sent to the lab to see if GBS is present. What happens during the test?  You will remove your clothing from the waist down.  You will lie down on an exam table in the same position as you would for a pelvic exam.  Your health care provider will swab your vagina and rectum to collect samples for a culture test.  You will be able to go home after the test and do all your usual activities.   How are the results reported? The test results are reported as positive or negative. What do the results mean?  A positive test means you are at risk for passing GBS to your baby during labor and delivery. Your health care provider will recommend that you are treated with an  IV antibiotic during labor and delivery.  A negative test means you are at very low risk of passing GBS to your baby. There is still a low risk of passing GBS to your baby because sometimes test results may report that you do not have a condition when you do (false-negative result) or there is a chance that you may become infected with GBS after the test is done. You most likely will not need to be treated with an antibiotic during labor and delivery. Talk with your health care provider about what your results  mean. Questions to ask your health care provider Ask your health care provider, or the department that is doing the test:  When will my results be ready?  How will I get my results?  What are my treatment options? Summary  Routine testing (screening) for group B streptococcus (GBS) is recommended for all pregnant women between the 36th and 37th week of pregnancy.  GBS is a type of bacteria that can be passed from mother to baby during childbirth.  If testing shows that you have GBS, your health care provider will recommend that you are treated with IV antibiotics during labor and delivery. This treatment almost always prevents infection in newborns. This information is not intended to replace advice given to you by your health care provider. Make sure you discuss any questions you have with your health care provider. Document Revised: 07/01/2020 Document Reviewed: 09/27/2018 Elsevier Patient Education  2021 Elsevier Inc.        Preterm Labor The normal length of a pregnancy is 39-41 weeks. Preterm labor is when labor starts before 37 completed weeks of pregnancy. Babies who are born prematurely and survive may not be fully developed and may be at an increased risk for long-term problems such as cerebral palsy, developmental delays, and vision and hearing problems. Babies who are born too early may have problems soon after birth. Problems may include regulating blood sugar, body temperature, heart rate, and breathing rate. These babies often have trouble with feeding. The risk of having problems is highest for babies who are born before 34 weeks of pregnancy. What are the causes? The exact cause of this condition is not known. What increases the risk? You are more likely to have preterm labor if you have certain risk factors that relate to your medical history, problems with present and past pregnancies, and lifestyle factors. Medical history  You have abnormalities of the uterus,  including a short cervix.  You have STIs (sexually transmitted infections), or other infections of the urinary tract and the vagina.  You have chronic illnesses, such as blood clotting problems, diabetes, or high blood pressure.  You are overweight or underweight. Present and past pregnancies  You have had preterm labor before.  You are pregnant with twins or other multiples.  You have been diagnosed with a condition in which the placenta covers your cervix (placenta previa).  You waited less than 6 months between giving birth and becoming pregnant again.  Your unborn baby has some abnormalities.  You have vaginal bleeding during pregnancy.  You became pregnant through in vitro fertilization (IVF). Lifestyle and environmental factors  You use tobacco products.  You drink alcohol.  You use street drugs.  You have stress and no social support.  You experience domestic violence.  You are exposed to certain chemicals or environmental pollutants. Other factors  You are younger than age 66 or older than age 64. What are the signs or symptoms? Symptoms  of this condition include:  Cramps similar to those that can happen during a menstrual period. The cramps may happen with diarrhea.  Pain in the abdomen or lower back.  Regular contractions that may feel like tightening of the abdomen.  A feeling of increased pressure in the pelvis.  Increased watery or bloody mucus discharge from the vagina.  Water breaking (ruptured amniotic sac). How is this diagnosed? This condition is diagnosed based on:  Your medical history and a physical exam.  A pelvic exam.  An ultrasound.  Monitoring your uterus for contractions.  Other tests, including: ? A swab of the cervix to check for a chemical called fetal fibronectin. ? Urine tests. How is this treated? Treatment for this condition depends on the length of your pregnancy, your condition, and the health of your baby.  Treatment may include:  Taking medicines, such as: ? Hormone medicines. These may be given early in pregnancy to help support the pregnancy. ? Medicines to stop contractions. ? Medicines to help mature the baby's lungs. These may be prescribed if the risk of delivery is high. ? Medicines to prevent your baby from developing cerebral palsy.  Bed rest. If the labor happens before 34 weeks of pregnancy, you may need to stay in the hospital.  Delivery of the baby. Follow these instructions at home:  Do not use any products that contain nicotine or tobacco, such as cigarettes, e-cigarettes, and chewing tobacco. If you need help quitting, ask your health care provider.  Do not drink alcohol.  Take over-the-counter and prescription medicines only as told by your health care provider.  Rest as told by your health care provider.  Return to your normal activities as told by your health care provider. Ask your health care provider what activities are safe for you.  Keep all follow-up visits as told by your health care provider. This is important.   How is this prevented? To increase your chance of having a full-term pregnancy:  Do not use street drugs or medicines that have not been prescribed to you during your pregnancy.  Talk with your health care provider before taking any herbal supplements, even if you have been taking them regularly.  Make sure you gain a healthy amount of weight during your pregnancy.  Watch for infection. If you think that you might have an infection, get it checked right away. Symptoms of infection may include: ? Fever. ? Abnormal vaginal discharge or discharge that smells bad. ? Pain or burning with urination. ? Needing to urinate urgently. ? Frequently urinating or passing small amounts of urine frequently. ? Blood in your urine. ? Urine that smells bad or unusual.  Tell your health care provider if you have had preterm labor before. Contact a health care  provider if:  You think you are going into preterm labor.  You have signs or symptoms of preterm labor.  You have symptoms of infection. Get help right away if:  You are having regular, painful contractions every 5 minutes or less.  Your water breaks. Summary  Preterm labor is labor that starts before you reach 37 weeks of pregnancy.  Delivering your baby early increases your baby's risk of developing lifelong problems.  The exact cause of preterm labor is unknown. However, having an abnormal uterus, an STI (sexually transmitted infection), or vaginal bleeding during pregnancy increases your risk for preterm labor.  Keep all follow-up visits as told by your health care provider. This is important.  Contact a health care  provider if you have signs or symptoms of preterm labor. This information is not intended to replace advice given to you by your health care provider. Make sure you discuss any questions you have with your health care provider. Document Revised: 10/02/2019 Document Reviewed: 10/02/2019 Elsevier Patient Education  2021 [employer].

## 2020-12-25 LAB — CBC
Hematocrit: 32.4 % — ABNORMAL LOW (ref 34.0–46.6)
Hemoglobin: 10.3 g/dL — ABNORMAL LOW (ref 11.1–15.9)
MCH: 24.4 pg — ABNORMAL LOW (ref 26.6–33.0)
MCHC: 31.8 g/dL (ref 31.5–35.7)
MCV: 77 fL — ABNORMAL LOW (ref 79–97)
Platelets: 147 10*3/uL — ABNORMAL LOW (ref 150–450)
RBC: 4.22 x10E6/uL (ref 3.77–5.28)
RDW: 15.3 % (ref 11.7–15.4)
WBC: 5.9 10*3/uL (ref 3.4–10.8)

## 2020-12-31 ENCOUNTER — Other Ambulatory Visit (HOSPITAL_COMMUNITY)
Admission: RE | Admit: 2020-12-31 | Discharge: 2020-12-31 | Disposition: A | Discharge status: Home / Self Care | Payer: 59 | Source: Ambulatory Visit | Attending: Nurse Practitioner | Admitting: Nurse Practitioner

## 2020-12-31 ENCOUNTER — Other Ambulatory Visit: Payer: Self-pay

## 2020-12-31 ENCOUNTER — Ambulatory Visit (INDEPENDENT_AMBULATORY_CARE_PROVIDER_SITE_OTHER): Payer: 59 | Admitting: Nurse Practitioner

## 2020-12-31 VITALS — BP 119/75 | HR 88 | Wt 180.2 lb

## 2020-12-31 DIAGNOSIS — Z3400 Encounter for supervision of normal first pregnancy, unspecified trimester: Secondary | ICD-10-CM

## 2020-12-31 DIAGNOSIS — Z3A36 36 weeks gestation of pregnancy: Secondary | ICD-10-CM

## 2020-12-31 DIAGNOSIS — O1203 Gestational edema, third trimester: Secondary | ICD-10-CM

## 2020-12-31 NOTE — Progress Notes (Signed)
    Subjective:  Cassie Church is a 25 y.o. G1P0000 at [redacted]w[redacted]d being seen today for ongoing prenatal care.  She is currently monitored for the following issues for this low-risk pregnancy and has Supervision of normal pregnancy, antepartum; Alpha thalassemia silent carrier; and Anemia in pregnancy on their problem list.  Patient reports no complaints.  Contractions: Not present. Vag. Bleeding: None.  Movement: Present. Denies leaking of fluid.   The following portions of the patient's history were reviewed and updated as appropriate: allergies, current medications, past family history, past medical history, past social history, past surgical history and problem list. Problem list updated.  Objective:   Vitals:   12/31/20 1110  BP: 119/75  Pulse: 88  Weight: 180 lb 3.2 oz (81.7 kg)    Fetal Status: Fetal Heart Rate (bpm): 135 Fundal Height: 37 cm Movement: Present  Presentation: Vertex  General:  Alert, oriented and cooperative. Patient is in no acute distress.  Skin: Skin is warm and dry. No rash noted.   Cardiovascular: Normal heart rate noted  Respiratory: Normal respiratory effort, no problems with respiration noted  Abdomen: Soft, gravid, appropriate for gestational age. Pain/Pressure: Absent     Pelvic:  Cervical exam performed     Station: -2  Cervix very posterior - not able to assess  Extremities: Normal range of motion.  Edema: Deep pitting, indentation remains for a short time  Mental Status: Normal mood and affect. Normal behavior. Normal judgment and thought content.   Urinalysis:      Assessment and Plan:  Pregnancy: G1P0000 at 104w5d  1. Supervision of normal first pregnancy, antepartum Baby moving well Normal BP Given in fo about IUD for contraception - still unsure about method To call pediatrician this week Has had online classes  - Cervicovaginal ancillary only( [facility]) - Strep Gp B NAA  2. Edema during pregnancy in third trimester Advised compression  knee highs Reviewed signs of worsening BP Hands and fingers swollen but not as severely as lower extremities - advised to make sure rings can be removed   Term labor symptoms and general obstetric precautions including but not limited to vaginal bleeding, contractions, leaking of fluid and fetal movement were reviewed in detail with the patient. Please refer to After Visit Summary for other counseling recommendations.  Return in about 1 week (around 01/07/2021) for in person ROB.  Nolene Bernheim, RN, MSN, NP-BC Nurse Practitioner, [facility] for [employer], [facility] 12/31/2020 11:34 AM

## 2020-12-31 NOTE — Progress Notes (Signed)
Pt reports fetal movement, denies pain.  

## 2020-12-31 NOTE — Patient Instructions (Signed)
Signs and Symptoms of Labor Labor is the body's natural process of moving the baby and the placenta out of the uterus. The process of labor usually starts when the baby is full-term, between 37 and 40 weeks of pregnancy. Signs and symptoms that you are close to going into labor As your body prepares for labor and the birth of your baby, you may notice the following symptoms in the weeks and days before true labor starts:  Passing a small amount of thick, bloody mucus from your vagina. This is called normal bloody show or losing your mucus plug. This may happen more than a week before labor begins, or right before labor begins, as the opening of the cervix starts to widen (dilate). For some women, the entire mucus plug passes at once. For others, pieces of the mucus plug may gradually pass over several days.  Your baby moving (dropping) lower in your pelvis to get into position for birth (lightening). When this happens, you may feel more pressure on your bladder and pelvic bone and less pressure on your ribs. This may make it easier to breathe. It may also cause you to need to urinate more often and have problems with bowel movements.  Having "practice contractions," also called Braxton Hicks contractions or false labor. These occur at irregular (unevenly spaced) intervals that are more than 10 minutes apart. False labor contractions are common after exercise or sexual activity. They will stop if you change position, rest, or drink fluids. These contractions are usually mild and do not get stronger over time. They may feel like: ? A backache or back pain. ? Mild cramps, similar to menstrual cramps. ? Tightening or pressure in your abdomen. Other early symptoms include:  Nausea or loss of appetite.  Diarrhea.  Having a sudden burst of energy, or feeling very tired.  Mood changes.  Having trouble sleeping.   Signs and symptoms that labor has begun Signs that you are in labor may  include:  Having contractions that come at regular (evenly spaced) intervals and increase in intensity. This may feel like more intense tightening or pressure in your abdomen that moves to your back. ? Contractions may also feel like rhythmic pain in your upper thighs or back that comes and goes at regular intervals. ? For first-time mothers, this change in intensity of contractions often occurs at a more gradual pace. ? Women who have given birth before may notice a more rapid progression of contraction changes.  Feeling pressure in the vaginal area.  Your water breaking (rupture of membranes). This is when the sac of fluid that surrounds your baby breaks. Fluid leaking from your vagina may be clear or blood-tinged. Labor usually starts within 24 hours of your water breaking, but it may take longer to begin. ? Some women may feel a sudden gush of fluid. ? Others notice that their underwear repeatedly becomes damp. Follow these instructions at home:  When labor starts, or if your water breaks, call your health care provider or nurse care line. Based on your situation, they will determine when you should go in for an exam.  During early labor, you may be able to rest and manage symptoms at home. Some strategies to try at home include: ? Breathing and relaxation techniques. ? Taking a warm bath or shower. ? Listening to music. ? Using a heating pad on the lower back for pain. If you are directed to use heat:  Place a towel between your skin and the   heat source.  Leave the heat on for 20-30 minutes.  Remove the heat if your skin turns bright red. This is especially important if you are unable to feel pain, heat, or cold. You may have a greater risk of getting burned.   Contact a health care provider if:  Your labor has started.  Your water breaks. Get help right away if:  You have painful, regular contractions that are 5 minutes apart or less.  Labor starts before you are [redacted] weeks  along in your pregnancy.  You have a fever.  You have bright red blood coming from your vagina.  You do not feel your baby moving.  You have a severe headache with or without vision problems.  You have severe nausea, vomiting, or diarrhea.  You have chest pain or shortness of breath. These symptoms may represent a serious problem that is an emergency. Do not wait to see if the symptoms will go away. Get medical help right away. Call your local emergency services (911 in the [place] Do not drive yourself to the hospital. Summary  Labor is your body's natural process of moving your baby and the placenta out of your uterus.  The process of labor usually starts when your baby is full-term, between 30 and 40 weeks of pregnancy.  When labor starts, or if your water breaks, call your health care provider or nurse care line. Based on your situation, they will determine when you should go in for an exam. This information is not intended to replace advice given to you by your health care provider. Make sure you discuss any questions you have with your health care provider. Document Revised: 06/21/2020 Document Reviewed: 06/21/2020 Elsevier Patient Education  2021 Elsevier Inc.  Intrauterine Device Information An intrauterine device (IUD) is a medical device that is inserted into the uterus to prevent pregnancy. It is a small, T-shaped device that has one or two nylon strings hanging down from it. The strings hang out of the lower part of the uterus (cervix) to allow for future IUD removal. There are two types of IUDs:  Hormone IUD. This type of IUD is made of plastic and contains the hormone progestin (synthetic progesterone). A hormone IUD may last 3-5 years.  Copper IUD. This type of IUD has copper wire wrapped around it. A copper IUD may last up to 10 years. How is an IUD inserted? An IUD is inserted through the vagina, through the cervix, and into the uterus with a minor medical procedure.  The procedure for IUD insertion may vary among health care providers and hospitals. How does an IUD work? Synthetic progesterone in a hormonal IUD prevents pregnancy by:  Thickening cervical mucus to prevent sperm from entering the uterus.  Thinning the uterine lining to prevent a fertilized egg from being implanted there. Copper in a copper IUD prevents pregnancy by making the uterus and fallopian tubes produce a fluid that kills sperm. What are the advantages of an IUD? Advantages of either type of IUD An IUD:  Is highly effective in preventing pregnancy.  Is reversible. You can become pregnant shortly after the IUD is removed.  Is low-maintenance and can stay in place for a long time.  Has no estrogen-related side effects.  Can be used when breastfeeding.  Is not associated with weight gain.  Can be inserted right after childbirth, an abortion, or a miscarriage. Advantages of a hormone IUD  If it is inserted within 7 days of your period starting, it  works right after it has been inserted. If the hormone IUD is inserted at any other time in your cycle, you will need to use a backup method of birth control for 7 days after insertion.  It can make menstrual periods lighter or stop completely.  It can reduce menstrual cramping and other discomforts from menstrual periods.  It can be used for 3-5 years, depending on which IUD you have. Advantages of a copper IUD  It works right after it is inserted.  It can be used as a form of emergency birth control if it is inserted within 5 days after having unprotected sex.  It does not interfere with your body's natural hormones.  It can be used for up to 10 years. What are the disadvantages of an IUD?  An IUD may cause irregular menstrual bleeding for a period of time after insertion.  It is common to have pain during insertion and have cramping and vaginal bleeding after insertion.  An IUD may cut the uterus (uterine  perforation) when it is inserted. This is rare.  Pelvic inflammatory disease (PID) may happen after insertion of an IUD. PID is an infection in the uterus and fallopian tubes. The IUD does not cause the infection. The infection is usually from an unknown sexually transmitted infection (STI). This is rare, and it usually happens during the first 20 days after the IUD is inserted.  A copper IUD can make your menstrual flow heavier and more painful.  IUDs cannot prevent sexually transmitted infections (STIs). How is an IUD removed?   You will lie on your back with your knees bent and your feet in footrests (stirrups).  A device will be inserted into your vagina to spread apart the vaginal walls (speculum). This will allow your health care provider to see the strings attached to the IUD.  Your health care provider will use a small instrument (forceps) to grasp the IUD strings and will pull firmly until the IUD is removed. You may have some discomfort when the IUD is removed. Your health care provider may recommend taking over-the-counter pain relievers, such as ibuprofen, before the procedure. You may also have minor spotting for a few days after the procedure. The procedure for IUD removal may vary among health care providers and hospitals. Is an IUD right for me? If you are interested in an IUD, discuss it with your health care provider. He or she will make sure you are a good candidate for an IUD and will let you know more about the advantages, disadvantage, and possible side effects. This will allow you to make a decision about the device. Summary  An intrauterine device (IUD) is a medical device that is inserted in the uterus to prevent pregnancy. It is a small, T-shaped device that has one or two nylon strings hanging down from it.  A hormone IUD contains the hormone progestin (synthetic progesterone). A copper IUD has copper wire wrapped around it.  Synthetic progesterone in a hormone IUD  prevents pregnancy by thickening cervical mucus and thinning the walls of the uterus. Copper in a copper IUD prevents pregnancy by making the uterus and fallopian tubes produce a fluid that kills sperm.  A hormone IUD can be left in place for 3-5 years. A copper IUD can be left in place for up to 10 years.  An IUD is inserted and removed by a health care provider. You may feel some pain during insertion and removal. Your health care provider may recommend  taking over-the-counter pain medicine, such as ibuprofen, before an IUD procedure. This information is not intended to replace advice given to you by your health care provider. Make sure you discuss any questions you have with your health care provider. Document Revised: 03/12/2020 Document Reviewed: 03/12/2020 Elsevier Patient Education  2021 [employer].

## 2021-01-01 LAB — CERVICOVAGINAL ANCILLARY ONLY
Chlamydia: NEGATIVE
Comment: NEGATIVE
Comment: NORMAL
Neisseria Gonorrhea: NEGATIVE

## 2021-01-02 LAB — STREP GP B NAA: Strep Gp B NAA: POSITIVE — AB

## 2021-01-07 ENCOUNTER — Encounter: Payer: Self-pay | Admitting: Nurse Practitioner

## 2021-01-07 DIAGNOSIS — O99820 Streptococcus B carrier state complicating pregnancy: Secondary | ICD-10-CM | POA: Insufficient documentation

## 2021-01-08 ENCOUNTER — Other Ambulatory Visit: Payer: Self-pay

## 2021-01-08 ENCOUNTER — Ambulatory Visit (INDEPENDENT_AMBULATORY_CARE_PROVIDER_SITE_OTHER): Payer: 59

## 2021-01-08 VITALS — BP 104/65 | HR 82 | Wt 183.0 lb

## 2021-01-08 DIAGNOSIS — Z3A37 37 weeks gestation of pregnancy: Secondary | ICD-10-CM

## 2021-01-08 DIAGNOSIS — Z3400 Encounter for supervision of normal first pregnancy, unspecified trimester: Secondary | ICD-10-CM

## 2021-01-08 DIAGNOSIS — O99820 Streptococcus B carrier state complicating pregnancy: Secondary | ICD-10-CM

## 2021-01-08 NOTE — Patient Instructions (Signed)

## 2021-01-08 NOTE — Progress Notes (Signed)
   LOW-RISK PREGNANCY OFFICE VISIT  Patient name: Cassie Church MRN [id number]  Date of birth: 1995-09-26 Chief Complaint:   Routine Prenatal Visit  Subjective:   Cassie Church is a 25 y.o. G1P0000 female at [redacted]w[redacted]d with an Estimated Date of Delivery: 01/23/21 being seen today for ongoing management of a low-risk pregnancy aeb has Supervision of normal pregnancy, antepartum; Alpha thalassemia silent carrier; Anemia in pregnancy; and Group B Streptococcus carrier, +RV culture, currently pregnant on their problem list.  Patient presents today with no complaints.  She expresses concern about GBS results. Patient endorses fetal movement. Patient denies abdominal cramping or contractions.  Patient denies vaginal concerns including abnormal discharge, leaking of fluid, and bleeding.  Contractions: Not present. Vag. Bleeding: None.  Movement: Present.  Reviewed past medical,surgical, social, obstetrical and family history as well as problem list, medications and allergies.  Objective   Vitals:   01/08/21 1328  BP: 104/65  Pulse: 82  Weight: 183 lb (83 kg)  Body mass index is 29.54 kg/m.  Total Weight Gain:23 lb (10.4 kg)         Physical Examination:   General appearance: Well appearing, and in no distress  Mental status: Alert, oriented to person, place, and time  Skin: Warm & dry  Cardiovascular: Normal heart rate noted  Respiratory: Normal respiratory effort, no distress  Abdomen: Soft, gravid, nontender, AGA with Fundal Height: 38 cm  Pelvic: Cervical exam deferred           Extremities: Edema: Mild pitting, slight indentation  Fetal Status: Fetal Heart Rate (bpm): 157  Movement: Present   No results found for this or any previous visit (from the past 24 hour(s)).  Assessment & Plan:  Low-risk pregnancy of a 25 y.o., G1P0000 at [redacted]w[redacted]d with an Estimated Date of Delivery: 01/23/21   1. Supervision of normal first pregnancy, antepartum -Anticipatory guidance for upcoming  appts. -Patient to next appt in 1 weeks for an in-person visit. -Reviewed postpartum preparations. Expecting female child-desires circumcision, but will discuss with FOB.    2. [redacted] weeks gestation of pregnancy -Doing well -Reviewed GBS results. -Informed of what they mean and how and why we treat. -Briefly reviewed induction of labor. -Informed that she would be scheduled for [redacted] week GA at next appt.       Meds: No orders of the defined types were placed in this encounter.  Labs/procedures today:  Lab Orders  No laboratory test(s) ordered today     Reviewed: Term labor symptoms and general obstetric precautions including but not limited to vaginal bleeding, contractions, leaking of fluid and fetal movement were reviewed in detail with the patient.  All questions were answered.  Follow-up: Return in about 1 week (around 01/15/2021) for LROB.  No orders of the defined types were placed in this encounter.  Cherre Robins MSN, CNM 01/08/2021

## 2021-01-15 ENCOUNTER — Inpatient Hospital Stay (HOSPITAL_COMMUNITY)
Admission: AD | Admit: 2021-01-15 | Discharge: 2021-01-18 | DRG: 806 | Disposition: A | Discharge status: Home / Self Care | Payer: 59 | Attending: Obstetrics and Gynecology | Admitting: Obstetrics and Gynecology

## 2021-01-15 ENCOUNTER — Other Ambulatory Visit: Payer: Self-pay

## 2021-01-15 ENCOUNTER — Encounter (HOSPITAL_COMMUNITY): Payer: Self-pay | Admitting: Obstetrics & Gynecology

## 2021-01-15 ENCOUNTER — Encounter: Payer: 59 | Admitting: Obstetrics

## 2021-01-15 ENCOUNTER — Telehealth: Payer: Self-pay

## 2021-01-15 DIAGNOSIS — O99824 Streptococcus B carrier state complicating childbirth: Secondary | ICD-10-CM | POA: Diagnosis present

## 2021-01-15 DIAGNOSIS — O9912 Other diseases of the blood and blood-forming organs and certain disorders involving the immune mechanism complicating childbirth: Secondary | ICD-10-CM | POA: Diagnosis present

## 2021-01-15 DIAGNOSIS — D6959 Other secondary thrombocytopenia: Secondary | ICD-10-CM | POA: Diagnosis present

## 2021-01-15 DIAGNOSIS — Z3A38 38 weeks gestation of pregnancy: Secondary | ICD-10-CM | POA: Diagnosis not present

## 2021-01-15 DIAGNOSIS — Z3400 Encounter for supervision of normal first pregnancy, unspecified trimester: Secondary | ICD-10-CM

## 2021-01-15 DIAGNOSIS — D563 Thalassemia minor: Secondary | ICD-10-CM | POA: Diagnosis present

## 2021-01-15 DIAGNOSIS — O99820 Streptococcus B carrier state complicating pregnancy: Secondary | ICD-10-CM

## 2021-01-15 DIAGNOSIS — O9902 Anemia complicating childbirth: Secondary | ICD-10-CM | POA: Diagnosis present

## 2021-01-15 DIAGNOSIS — Z3490 Encounter for supervision of normal pregnancy, unspecified, unspecified trimester: Secondary | ICD-10-CM

## 2021-01-15 DIAGNOSIS — O99013 Anemia complicating pregnancy, third trimester: Secondary | ICD-10-CM

## 2021-01-15 DIAGNOSIS — O99019 Anemia complicating pregnancy, unspecified trimester: Secondary | ICD-10-CM | POA: Diagnosis present

## 2021-01-15 DIAGNOSIS — Z20822 Contact with and (suspected) exposure to covid-19: Secondary | ICD-10-CM | POA: Diagnosis present

## 2021-01-15 DIAGNOSIS — O4290 Premature rupture of membranes, unspecified as to length of time between rupture and onset of labor, unspecified weeks of gestation: Secondary | ICD-10-CM | POA: Diagnosis present

## 2021-01-15 DIAGNOSIS — O26893 Other specified pregnancy related conditions, third trimester: Secondary | ICD-10-CM | POA: Diagnosis present

## 2021-01-15 DIAGNOSIS — O4292 Full-term premature rupture of membranes, unspecified as to length of time between rupture and onset of labor: Principal | ICD-10-CM | POA: Diagnosis present

## 2021-01-15 DIAGNOSIS — D696 Thrombocytopenia, unspecified: Secondary | ICD-10-CM | POA: Diagnosis not present

## 2021-01-15 LAB — CBC
HCT: 33.5 % — ABNORMAL LOW (ref 36.0–46.0)
Hemoglobin: 10.3 g/dL — ABNORMAL LOW (ref 12.0–15.0)
MCH: 24.3 pg — ABNORMAL LOW (ref 26.0–34.0)
MCHC: 30.7 g/dL (ref 30.0–36.0)
MCV: 79.2 fL — ABNORMAL LOW (ref 80.0–100.0)
Platelets: 130 10*3/uL — ABNORMAL LOW (ref 150–400)
RBC: 4.23 MIL/uL (ref 3.87–5.11)
RDW: 19.6 % — ABNORMAL HIGH (ref 11.5–15.5)
WBC: 5.3 10*3/uL (ref 4.0–10.5)
nRBC: 0 % (ref 0.0–0.2)

## 2021-01-15 LAB — TYPE AND SCREEN
ABO/RH(D): A POS
Antibody Screen: NEGATIVE

## 2021-01-15 LAB — RESP PANEL BY RT-PCR (FLU A&B, COVID) ARPGX2
Influenza A by PCR: NEGATIVE
Influenza B by PCR: NEGATIVE
SARS Coronavirus 2 by RT PCR: NEGATIVE

## 2021-01-15 LAB — POCT FERN TEST: POCT Fern Test: POSITIVE

## 2021-01-15 MED ORDER — SODIUM CHLORIDE 0.9 % IV SOLN
5.0000 10*6.[IU] | Freq: Once | INTRAVENOUS | Status: AC
  Administered 2021-01-15: 5 10*6.[IU] via INTRAVENOUS
  Filled 2021-01-15: qty 5

## 2021-01-15 MED ORDER — LACTATED RINGERS IV SOLN: INTRAVENOUS | Status: DC

## 2021-01-15 MED ORDER — ACETAMINOPHEN 325 MG PO TABS: 650.0000 mg | ORAL_TABLET | ORAL | Status: DC | PRN

## 2021-01-15 MED ORDER — OXYTOCIN-SODIUM CHLORIDE 30-0.9 UT/500ML-% IV SOLN
2.5000 [IU]/h | INTRAVENOUS | Status: DC
  Administered 2021-01-16: 2.5 [IU]/h via INTRAVENOUS
  Filled 2021-01-15: qty 500

## 2021-01-15 MED ORDER — OXYCODONE-ACETAMINOPHEN 5-325 MG PO TABS: 1.0000 | ORAL_TABLET | ORAL | Status: DC | PRN

## 2021-01-15 MED ORDER — LIDOCAINE HCL (PF) 1 % IJ SOLN
30.0000 mL | INTRAMUSCULAR | Status: AC | PRN
  Administered 2021-01-16: 30 mL via SUBCUTANEOUS
  Filled 2021-01-15: qty 30

## 2021-01-15 MED ORDER — LACTATED RINGERS IV SOLN: 500.0000 mL | INTRAVENOUS | Status: DC | PRN

## 2021-01-15 MED ORDER — ONDANSETRON HCL 4 MG/2ML IJ SOLN: 4.0000 mg | Freq: Four times a day (QID) | INTRAMUSCULAR | Status: DC | PRN

## 2021-01-15 MED ORDER — PENICILLIN G POT IN DEXTROSE 60000 UNIT/ML IV SOLN
3.0000 10*6.[IU] | INTRAVENOUS | Status: DC
  Administered 2021-01-15 (×2): 3 10*6.[IU] via INTRAVENOUS
  Filled 2021-01-15 (×3): qty 50

## 2021-01-15 MED ORDER — OXYCODONE-ACETAMINOPHEN 5-325 MG PO TABS: 2.0000 | ORAL_TABLET | ORAL | Status: DC | PRN

## 2021-01-15 MED ORDER — OXYTOCIN BOLUS FROM INFUSION
333.0000 mL | Freq: Once | INTRAVENOUS | Status: AC
  Administered 2021-01-16: 333 mL via INTRAVENOUS

## 2021-01-15 MED ORDER — TERBUTALINE SULFATE 1 MG/ML IJ SOLN: 0.2500 mg | Freq: Once | INTRAMUSCULAR | Status: DC | PRN

## 2021-01-15 MED ORDER — SOD CITRATE-CITRIC ACID 500-334 MG/5ML PO SOLN: 30.0000 mL | ORAL | Status: DC | PRN

## 2021-01-15 MED ORDER — OXYTOCIN-SODIUM CHLORIDE 30-0.9 UT/500ML-% IV SOLN
1.0000 m[IU]/min | INTRAVENOUS | Status: DC
  Administered 2021-01-15: 2 m[IU]/min via INTRAVENOUS

## 2021-01-15 MED ORDER — FENTANYL CITRATE (PF) 100 MCG/2ML IJ SOLN
100.0000 ug | INTRAMUSCULAR | Status: DC | PRN
  Administered 2021-01-15 – 2021-01-16 (×2): 100 ug via INTRAVENOUS
  Filled 2021-01-15 (×2): qty 2

## 2021-01-15 NOTE — H&P (Signed)
OBSTETRIC ADMISSION HISTORY AND PHYSICAL  Cassie Church is a 25 y.o. female [place] with IUP at 21w6dby L/10 presenting for SROM at 1100 on 01/15/21. She reports +FMs, No LOF, no VB, no blurry vision, headaches or peripheral edema, and RUQ pain.  She plans on breast feeding. She is undecided for birth control. She received her [facility] care [facility] L[facility] --->  Estimated Date of Delivery: 01/23/21  Sono:  _0 , CWD, normal anatomy, breech presentation, 298g, 35% EFW  Prenatal History/Complications:  - gestational thrombocytopenia - alpha thalassemia carrier  Past Medical History: Past Medical History:  Diagnosis Date  . Herpes simplex virus (HSV) infection     Past Surgical History: Past Surgical History:  Procedure Laterality Date  . WISDOM TOOTH EXTRACTION Bilateral 2014    Obstetrical History: OB History    Gravida  1   Para  0   Term  0   Preterm  0   AB  0   Living  0     SAB  0   IAB  0   Ectopic  0   Multiple  0   Live Births              Social History Social History   Socioeconomic History  . Marital status: Single    Spouse name: Not on file  . Number of children: Not on file  . Years of education: Not on file  . Highest education level: Not on file  Occupational History  . Not on file  Tobacco Use  . Smoking status: Never Smoker  . Smokeless tobacco: Never Used  Vaping Use  . Vaping Use: Never used  Substance and Sexual Activity  . Alcohol use: Not Currently    Comment: socially   . Drug use: No  . Sexual activity: Yes    Partners: Male    Birth control/protection: None  Other Topics Concern  . Not on file  Social History Narrative  . Not on file   Social Determinants of Health   Financial Resource Strain: Not on file  Food Insecurity: Not on file  Transportation Needs: Not on file  Physical Activity: Not on file  Stress: Not on file  Social Connections: Not on file    Family History: Family History   Problem Relation Age of Onset  . Hyperlipidemia Father   . Hypertension Father   . Diabetes Paternal Grandfather     Allergies: No Known Allergies  Medications Prior to Admission  Medication Sig Dispense Refill Last Dose  . Prenatal MV & Min w/FA-DHA (PRENATAL GUMMIES PO) Take by mouth.   01/14/2021 at Unknown time  . Blood Pressure Monitor KIT 1 Device by Does not apply route once a week. To be monitored Regularly at home. 1 kit 0      Review of Systems   All systems reviewed and negative except as stated in HPI  Blood pressure 129/64, pulse 79, temperature 98.3 F (36.8 C), temperature source Oral, resp. rate 19, last menstrual period 04/18/2020, SpO2 100 %. General appearance: alert, cooperative and appears stated age Lungs: normal WOB Heart: regular rate  Abdomen: soft, non-tender Extremities: no sign of DVT Presentation: cephalic Fetal monitoringBaseline: 140 bpm, Variability: Good {> 6 bpm), Accelerations: Reactive and Decelerations: Absent Uterine activityFrequency: irregular Dilation: 2 Effacement (%): 70 Station: -2 Exam by:: VCloretta Ned RN   Prenatal labs: ABO, Rh: A/Positive/-- (10/20 1107) Antibody: Negative (10/20 1107) Rubella: 3.17 (10/20 1107) RPR: Non  Reactive (02/09 1032)  HBsAg: Negative (10/20 1107)  HIV: Non Reactive (02/09 1032)  GBS: Positive/-- (04/20 1124)  2 hr Glucola wnl Genetic screening wnl except for alpha thal carrier Anatomy US wnl  Prenatal Transfer Tool  Maternal Diabetes: No Genetic Screening: Normal except for maternal alpha thal carrier Maternal Ultrasounds/Referrals: Normal Fetal Ultrasounds or other Referrals:  None Maternal Substance Abuse:  No Significant Maternal Medications:  None Significant Maternal Lab Results: Group B Strep positive  Results for orders placed or performed during the hospital encounter of 01/15/21 (from the past 24 hour(s))  POCT fern test   Collection Time: 01/15/21  2:25 PM  Result Value  Ref Range   POCT Fern Test Positive = ruptured amniotic membanes     Patient Active Problem List   Diagnosis Date Noted  . Group B Streptococcus carrier, +RV culture, currently pregnant 01/07/2021  . Anemia in pregnancy 10/24/2020  . Alpha thalassemia silent carrier 07/14/2020  . Supervision of normal pregnancy, antepartum 05/28/2020    Assessment/Plan:  Cassie Church is a 25 y.o. G1P0000 at 16w6dhere for labor management s/p SROM at 1100 on 5/5.  #Labor s/p SROM: Will manage expectantly and augment as clinically indicated. #Pain: TBD per pt preference #FWB: Category 1 strip #ID: GBS+ >PCN started on admission #MOF: breast #MOC: undecided; counseled on admission #Circ: desired  Cassie Church, AGildardo Cranker MD OB Fellow, Faculty Practice 01/15/2021 2:46 PM

## 2021-01-15 NOTE — Telephone Encounter (Signed)
Pt called triage line and left message stating that she thinks her water broke. When I tried calling patient back, she did not answer. Left message on patient's voicemail that if she did think her water broke to head to MAU for evaluation. Advised patient to call back and let us know she did receive message.

## 2021-01-15 NOTE — Progress Notes (Addendum)
S: Doing well. Breathing well through contractions. Desires something for pain to cope with contractions.    O: Vitals:   01/15/21 1857 01/15/21 1936 01/15/21 2140 01/15/21 2141  BP: 132/75   (!) 141/77  Pulse: 74   80  Resp: 16     Temp:  98 F (36.7 C) 98.4 F (36.9 C)   TempSrc:  Oral Oral   SpO2:      Weight:      Height:         FHT:  FHR: 150 bpm, variability: moderate with periods of minimal ,  accelerations:  Present,  decelerations: recurrent early decles noted UC:   regular, every  minutes SVE:   Dilation: 5.5 Effacement (%): 80 Station: 0,-1 Exam by:: Dorathy Daft, SNM   A / P: Augmentation of labor, progressing well on 63mili-units of pitocin.  Fetal Wellbeing:  Category II, but reassuring with positive scalp stimulation. Continue to monitor for sx of distress.  Pain Control:  IV pain meds. Pt may have epidural at request.  I/D: GBS+. Continue PCN prophylaxis up to delivery.    Anticipated MOD:  NSVD  Geanette Buonocore Danella Deis) Suzie Portela, BSN, RNC-OB  Student Nurse-Midwife   01/15/2021  11:19 PM

## 2021-01-15 NOTE — Progress Notes (Addendum)
Labor Progress Note  Cassie Church is a 24 y.o. G1P0000 at [redacted]w[redacted]d presented for SROM at 1100 on 5/5  S: Patient is doing well. No current concerns.  O:  BP 132/75   Pulse 74   Temp 97.8 F (36.6 C) (Oral)   Resp 16   Ht 5\' 7"  (1.702 m)   Wt 84.8 kg   LMP 04/18/2020   SpO2 100%   BMI 29.29 kg/m  EFM: 140 bpm/moderate variability/positive accelerations/no decels Toco: contractions q1-48m   CVE: Dilation: 3 Effacement (%): 70 Cervical Position: Posterior Station: -2 Presentation: Vertex Exam by:: Mikaylah Libbey   A&P: 25 y.o. G1P0000 [redacted]w[redacted]d presented for SROM at 1100 on 5/5 #Labor: Progressing well. s/p SROM. Started pitocin 2 milli-unit/min at 1845. Will titrate up as clinically indicated.  #Pain:  TBD per pt preference #FWB: Category 1 strip #ID: GBS+ >PCN started on admission. Second dose at 1900. Continue PCN q4h #Anemia of pregnancy: hgb 10.3 on admission. Supplement PO iron every other day. #MOF: breast #MOC: undecided #Circ: desired  [redacted]w[redacted]d, Medical Student 7:22 PM  Attestation of Supervision of Student:  I confirm that I have verified the information documented in the medical student's note and that I have also personally reperformed the history, physical exam and all medical decision making activities.  I have verified that all services and findings are accurately documented in this student's note; and I agree with management and plan as outlined in the documentation. I have also made any necessary editorial changes.  Cassie Olp, MD [facility], [facility] 01/15/2021 8:51 PM

## 2021-01-15 NOTE — MAU Note (Addendum)
...  Cassie Church is a 25 y.o. at [redacted]w[redacted]d here in MAU reporting: Water broke at 1100. Clear fluids, no odor. CTX every 10 minutes. Patient states she has not felt the baby move since this occurring but states, "I have been running around because my bags were not packed." No VB. GBS+.  FHT doppler: 160 Pain Score: 6/10 lower abdominal

## 2021-01-16 ENCOUNTER — Encounter (HOSPITAL_COMMUNITY): Payer: Self-pay | Admitting: Family Medicine

## 2021-01-16 DIAGNOSIS — O99824 Streptococcus B carrier state complicating childbirth: Secondary | ICD-10-CM

## 2021-01-16 DIAGNOSIS — O9912 Other diseases of the blood and blood-forming organs and certain disorders involving the immune mechanism complicating childbirth: Secondary | ICD-10-CM

## 2021-01-16 DIAGNOSIS — Z3A38 38 weeks gestation of pregnancy: Secondary | ICD-10-CM

## 2021-01-16 DIAGNOSIS — D696 Thrombocytopenia, unspecified: Secondary | ICD-10-CM

## 2021-01-16 DIAGNOSIS — O4290 Premature rupture of membranes, unspecified as to length of time between rupture and onset of labor, unspecified weeks of gestation: Secondary | ICD-10-CM | POA: Diagnosis present

## 2021-01-16 MED ORDER — DIPHENHYDRAMINE HCL 25 MG PO CAPS: 25.0000 mg | ORAL_CAPSULE | Freq: Four times a day (QID) | ORAL | Status: DC | PRN

## 2021-01-16 MED ORDER — TETANUS-DIPHTH-ACELL PERTUSSIS 5-2.5-18.5 LF-MCG/0.5 IM SUSY: 0.5000 mL | PREFILLED_SYRINGE | Freq: Once | INTRAMUSCULAR | Status: DC

## 2021-01-16 MED ORDER — BENZOCAINE-MENTHOL 20-0.5 % EX AERO
1.0000 "application " | INHALATION_SPRAY | CUTANEOUS | Status: DC | PRN
  Filled 2021-01-16: qty 56

## 2021-01-16 MED ORDER — MEASLES, MUMPS & RUBELLA VAC IJ SOLR: 0.5000 mL | Freq: Once | INTRAMUSCULAR | Status: DC

## 2021-01-16 MED ORDER — ONDANSETRON HCL 4 MG PO TABS: 4.0000 mg | ORAL_TABLET | ORAL | Status: DC | PRN

## 2021-01-16 MED ORDER — SENNOSIDES-DOCUSATE SODIUM 8.6-50 MG PO TABS
2.0000 | ORAL_TABLET | Freq: Every day | ORAL | Status: DC
  Administered 2021-01-17 – 2021-01-18 (×2): 2 via ORAL
  Filled 2021-01-16 (×2): qty 2

## 2021-01-16 MED ORDER — COCONUT OIL OIL
1.0000 "application " | TOPICAL_OIL | Status: DC | PRN
  Administered 2021-01-16: 1 via TOPICAL

## 2021-01-16 MED ORDER — IBUPROFEN 600 MG PO TABS: 600.0000 mg | ORAL_TABLET | Freq: Four times a day (QID) | ORAL | Status: DC

## 2021-01-16 MED ORDER — ONDANSETRON HCL 4 MG/2ML IJ SOLN
4.0000 mg | INTRAMUSCULAR | Status: DC | PRN
Start: 2021-01-16 — End: 2021-01-18

## 2021-01-16 MED ORDER — ZOLPIDEM TARTRATE 5 MG PO TABS: 5.0000 mg | ORAL_TABLET | Freq: Every evening | ORAL | Status: DC | PRN

## 2021-01-16 MED ORDER — PRENATAL MULTIVITAMIN CH
1.0000 | ORAL_TABLET | Freq: Every day | ORAL | Status: DC
  Filled 2021-01-16 (×2): qty 1

## 2021-01-16 MED ORDER — ACETAMINOPHEN 325 MG PO TABS: 650.0000 mg | ORAL_TABLET | ORAL | Status: DC | PRN

## 2021-01-16 MED ORDER — SIMETHICONE 80 MG PO CHEW: 80.0000 mg | CHEWABLE_TABLET | ORAL | Status: DC | PRN

## 2021-01-16 MED ORDER — FERROUS SULFATE 325 (65 FE) MG PO TABS
325.0000 mg | ORAL_TABLET | ORAL | Status: DC
  Administered 2021-01-16 – 2021-01-18 (×2): 325 mg via ORAL
  Filled 2021-01-16 (×2): qty 1

## 2021-01-16 MED ORDER — MEDROXYPROGESTERONE ACETATE 150 MG/ML IM SUSP: 150.0000 mg | INTRAMUSCULAR | Status: DC | PRN

## 2021-01-16 MED ORDER — IBUPROFEN 100 MG/5ML PO SUSP
600.0000 mg | Freq: Four times a day (QID) | ORAL | Status: DC
  Administered 2021-01-16 – 2021-01-18 (×8): 600 mg via ORAL
  Filled 2021-01-16 (×11): qty 30

## 2021-01-16 MED ORDER — WITCH HAZEL-GLYCERIN EX PADS: 1.0000 "application " | MEDICATED_PAD | CUTANEOUS | Status: DC | PRN

## 2021-01-16 MED ORDER — DIBUCAINE (PERIANAL) 1 % EX OINT: 1.0000 "application " | TOPICAL_OINTMENT | CUTANEOUS | Status: DC | PRN

## 2021-01-16 NOTE — Discharge Summary (Addendum)
Postpartum Discharge Summary  Date of Service updated 01/18/21     Patient Name: Cassie Church DOB: March 25, 1996 MRN: [id number]  Date of admission: 01/15/2021 Delivery date:01/16/2021  Delivering provider: Christin Fudge  Date of discharge: 01/18/2021  Admitting diagnosis: Supervision of low-risk first pregnancy [Z34.00] Intrauterine pregnancy: [redacted]w[redacted]d    Secondary diagnosis:  Principal Problem:   Postpartum care following vaginal delivery 5/6 Active Problems:   Supervision of normal pregnancy, antepartum   Alpha thalassemia silent carrier   Anemia in pregnancy   Group B Streptococcus carrier, +RV culture, currently pregnant   PROM (premature rupture of membranes)   Perineal laceration, second degree  Additional problems: none    Discharge diagnosis: Term Pregnancy Delivered                                              Post partum procedures:None Augmentation: Pitocin Complications: None  Hospital course: Induction of Labor With Vaginal Delivery   25y.o. yo G1P0000 at 337w0das admitted to the hospital 01/15/2021 for induction of labor.  Indication for induction: PROM.  Patient had an uncomplicated labor course as follows: Membrane Rupture Time/Date: 11:00 AM ,01/16/2020   Delivery Method:Vaginal, Spontaneous  Episiotomy: None  Lacerations:  2nd degree  Details of delivery can be found in separate delivery note.  Patient had a routine postpartum course. Patient is discharged home 01/18/21.  Newborn Data: Birth date:01/16/2021  Birth time:1:44 AM  Gender:Female  Living status:Living  Apgars:5 ,9  Weight:3490 g   Magnesium Sulfate received: No BMZ received: No Rhophylac:N/A MMR:N/A T-DaP:Given prenatally Flu: Yes Transfusion:No  Physical exam  Vitals:   01/17/21 0638 01/17/21 1405 01/17/21 2033 01/18/21 0534  BP: 107/63 121/81 131/81 120/89  Pulse: 77 64 60 68  Resp: '14 15 16 16  ' Temp: 98.2 F (36.8 C) 98.2 F (36.8 C) 97.7 F (36.5 C) 98.4 F (36.9 C)   TempSrc: Oral Oral Oral Oral  SpO2: 99% 100% 100% 100%  Weight:      Height:       General: alert, cooperative and no distress Lochia: appropriate Uterine Fundus: firm Incision: N/A DVT Evaluation: No evidence of DVT seen on physical exam. Labs: Lab Results  Component Value Date   WBC 5.3 01/15/2021   HGB 10.3 (L) 01/15/2021   HCT 33.5 (L) 01/15/2021   MCV 79.2 (L) 01/15/2021   PLT 130 (L) 01/15/2021   No flowsheet data found. Edinburgh Score: Edinburgh Postnatal Depression Scale Screening Tool 01/16/2021  I have been able to laugh and see the funny side of things. (No Data)     After visit meds:  Allergies as of 01/18/2021   No Known Allergies     Medication List    TAKE these medications   acetaminophen 500 MG tablet Commonly known as: TYLENOL Take 2 tablets (1,000 mg total) by mouth every 6 (six) hours as needed (for pain scale < 4).   Blood Pressure Monitor Kit 1 Device by Does not apply route once a week. To be monitored Regularly at home.   coconut oil Oil Apply 1 application topically as needed.   ibuprofen 100 MG/5ML suspension Commonly known as: ADVIL Take 30 mLs (600 mg total) by mouth every 6 (six) hours.   PRENATAL GUMMIES PO Take by mouth.        Discharge home in stable condition Infant Feeding:  Bottle and Breast Infant Disposition:home with mother Discharge instruction: per After Visit Summary and Postpartum booklet. Activity: Advance as tolerated. Pelvic rest for 6 weeks.  Diet: routine diet Future Appointments: Future Appointments  Date Time Provider [place]  02/16/2021 10:15 AM Leftwich-Kirby, Kathie Dike, CNM [facility] None   Follow up Visit:   Please schedule this patient for a In person postpartum visit in 4 weeks with the following provider: Any provider. Additional Postpartum F/U:  Low risk pregnancy complicated by: Thrombocytopenia (Platelets 130), Alpha Thalassemia carrier Delivery mode:  Vaginal, Spontaneous   Anticipated Birth Control:  Considering OP IUD vs. Nexplanon   0/0/2525 Arrie Senate, MD

## 2021-01-16 NOTE — Lactation Note (Addendum)
This note was copied from a baby's chart. Lactation Consultation Note  Patient Name: Cassie Church [id number] Date: 01/16/2021 Reason for consult: Follow-up assessment;Mother's request;Difficult latch;Primapara;1st time breastfeeding;Term Age:25 hours   RN, Melany Guernsey stated infant not feeding well with formula. LC did suck training and offering formula with curve tip and finger feeding. Infant took 1-2ml some lost from side of his mouth. With suck training he has high palate and with training he began to suck on the finger but he would not with the extra slow flow nipple. LC set up  5 french with 20 NS to increase volume at the breasts. Lab arrived to do a blood draw for glucose.   RN/IBCLC Dario Ave to assess and assist following blood draw.  Mom given breast shells to wear when she is not pumping, sleeping or nursing.  Maternal Data Has patient been taught Hand Expression?: Yes  Feeding Mother's Current Feeding Choice: Breast Milk  LATCH Score Latch: Repeated attempts needed to sustain latch, nipple held in mouth throughout feeding, stimulation needed to elicit sucking reflex.  Audible Swallowing: Spontaneous and intermittent  Type of Nipple: Everted at rest and after stimulation  Comfort (Breast/Nipple): Filling, red/small blisters or bruises, mild/mod discomfort  Hold (Positioning): Assistance needed to correctly position infant at breast and maintain latch.  LATCH Score: 7   Lactation Tools Discussed/Used Tools: Pump;Flanges Flange Size: 24 Breast pump type: Manual Pump Education: Setup, frequency, and cleaning;Milk Storage Reason for Pumping: increase stimulation Pumping frequency: every 3 hrs for 10 minutes each breast  Interventions Interventions: Breast feeding basics reviewed;Support pillows;Education;Assisted with latch;Position options;Skin to skin;Expressed milk;Breast massage;Hand express;Hand pump;Pre-pump if needed;Adjust position;Breast  compression;Comfort gels;Shells  Discharge Pump: Manual;Personal WIC Program: No  Consult Status Consult Status: Follow-up Date: 01/17/21 Follow-up type: In-patient    Sherral Dirocco  [name] 01/16/2021, 11:50 PM

## 2021-01-16 NOTE — Lactation Note (Signed)
This note was copied from a baby's chart. Lactation Consultation Note  Patient Name: Boy Teigen Parslow [id number] Date: 01/16/2021 Reason for consult: Follow-up assessment;Term;Primapara;1st time breastfeeding Age:25 hours   LC Follow Up Visit:  Mother requested lactation assistance.  When I arrived baby was swaddled and asleep in the bassinet.  Parents continue to be very concerned that their son is so sleepy and not feeding a lot.  Reviewed concepts discussed this morning and reassured parents that baby is behaving like a typical newborn at 10 hours of life would be like.  Mother has been practicing her hand expression (as discussed) and is concerned that she cannot obtain colostrum drops yet.  Provided lots of emotional reassurance and support to both parents.  Praised parents for what they have already learned and are doing to care for their newborn.    Grandmother in room visiting and is an additional support person.  Mother will continue to call for latch assistance as needed.  Staff needs to continue to review feeding and newborn care with family.   Maternal Data    Feeding Mother's Current Feeding Choice: Breast Milk  LATCH Score Latch: Grasps breast easily, tongue down, lips flanged, rhythmical sucking.  Audible Swallowing: A few with stimulation  Type of Nipple: Everted at rest and after stimulation  Comfort (Breast/Nipple): Filling, red/small blisters or bruises, mild/mod discomfort  Hold (Positioning): Assistance needed to correctly position infant at breast and maintain latch.  LATCH Score: 7   Lactation Tools Discussed/Used    Interventions Interventions: Education  Discharge    Consult Status Consult Status: Follow-up Date: 01/17/21 Follow-up type: In-patient    Rosalyn Archambault R Trinitie Mcgirr 01/16/2021, 12:40 PM

## 2021-01-16 NOTE — Lactation Note (Addendum)
This note was copied from a baby's chart. Lactation Consultation Note  Patient Name: Cassie Church [id number] Date: 01/16/2021 Reason for consult: Follow-up assessment;Mother's request;Difficult latch;Primapara;1st time breastfeeding;Term Age:25 hrs   LC arrived Mom stated infant latching for some feeds but mostly sleeping. Mom able to express breast milk from left breast and latch on left but not on the right.  LC set Mom on manual pump to pre pump 5-10 minutes. Drops of colostrum offered to infant via finger feeding.   LC assisted with latching on the right with breast compression and signs of milk transfer noted. Mom had some pain at first due to shallow latches during the day. Once we flanged out his bottom lip and brought his cheeks and nose it, her pain resolved. Dad assisting with breast compression, infant CAH sound noted during feeding.   Mom given comfort gels for nipple care. Mom aware to not use coconut oil with comfort gels. Mom to rinse comfort gels in between use and discard after 6 days.   Plan 1. Feed based on cues 8-12 x in 24 hr period no more than 4 hrs without an attempt. Mom to offer both breasts and look for swallows/signs of milk transfer.           2. Mom can pre pump with manual pump 5-10 minutes before latching and offer colostrum via finger feeding if unable to get infant to latch.           3. Pump q 3 hrs for 10 minutes each breasts.  All questions answered at the end of the visit.   LC alerted RN, Cassie Church, Mom been tearful not able to latch infant at all times for each feeding. RN  provided coconut oil. Mom decided to supplement with formula as per RN and breastfeeding supplementation guide provided.       Maternal Data Has patient been taught Hand Expression?: Yes  Feeding Mother's Current Feeding Choice: Breast Milk  LATCH Score Latch: Repeated attempts needed to sustain latch, nipple held in mouth throughout feeding, stimulation needed to elicit  sucking reflex.  Audible Swallowing: Spontaneous and intermittent  Type of Nipple: Everted at rest and after stimulation  Comfort (Breast/Nipple): Filling, red/small blisters or bruises, mild/mod discomfort  Hold (Positioning): Assistance needed to correctly position infant at breast and maintain latch.  LATCH Score: 7   Lactation Tools Discussed/Used Tools: Pump;Flanges Flange Size: 24 Breast pump type: Manual Pump Education: Setup, frequency, and cleaning;Milk Storage Reason for Pumping: increase stimulation Pumping frequency: every 3 hrs for 10 minutes each breast  Interventions Interventions: Breast feeding basics reviewed;Support pillows;Education;Assisted with latch;Position options;Skin to skin;Expressed milk;Breast massage;Hand express;Hand pump;Pre-pump if needed;Adjust position;Breast compression;Comfort gels;Shells  Discharge Pump: Manual;Personal WIC Program: No  Consult Status Consult Status: Follow-up Date: 01/17/21 Follow-up type: In-patient    Cassie Church  Cassie Church 01/16/2021, 9:04 PM

## 2021-01-16 NOTE — Lactation Note (Signed)
This note was copied from a baby's chart. Lactation Consultation Note  Patient Name: Cassie Church [id number] Date: 01/16/2021 Reason for consult: Initial assessment;Term;Primapara;1st time breastfeeding Age:25 hours   P1 mother whose infant is now 5 hours old.  This is a term baby at 39+0 weeks.  Mother requested latch assistance.  Baby was in mother's arms and not showing feeding cues when I arrived.  Mother had fed approximately 1 hour ago.  Reviewed hand expression and mother able to obtain a couple of colostrum drops which I finger fed back to baby.  Attempted to latch to the left breast in the football hold, however, baby was too sleepy.    Basic breast feeding education completed.  Mother will feed 8-12 times/24 hours or sooner if baby shows cues.  She will call for latch assistance as needed.  Demonstrated swaddling per parents' request.  Continue to review newborn care and breast feeding with mother.    Mom made aware of O/P services, breastfeeding support groups, community resources, and our phone # for post-discharge questions.  Mother has a DEBP for home use.  Placed baby in bassinet so parents may rest; mother desires nursing report to be done outside her room.  RN notified.   Maternal Data Has patient been taught Hand Expression?: Yes Does the patient have breastfeeding experience prior to this delivery?: No  Feeding Mother's Current Feeding Choice: Breast Milk  LATCH Score Latch: Too sleepy or reluctant, no latch achieved, no sucking elicited.  Audible Swallowing: None  Type of Nipple: Everted at rest and after stimulation  Comfort (Breast/Nipple): Soft / non-tender  Hold (Positioning): Assistance needed to correctly position infant at breast and maintain latch.  LATCH Score: 5   Lactation Tools Discussed/Used    Interventions Interventions: Breast feeding basics reviewed;Assisted with latch;Skin to skin;Breast massage;Hand express;Adjust position;Position  options;Support pillows;Education  Discharge Pump: Personal  Consult Status Consult Status: Follow-up Date: 01/17/21 Follow-up type: In-patient    [name] R [name] 01/16/2021, 7:07 AM

## 2021-01-16 NOTE — Plan of Care (Signed)

## 2021-01-16 NOTE — Lactation Note (Signed)
This note was copied from a baby's chart. Lactation Consultation Note  Patient Name: Cassie Church [id number] Date: 01/16/2021 Reason for consult: Initial assessment;Term;Primapara;1st time breastfeeding Age:25 hours   LC Order entered at [phone]  Initial LC Visit: Attempted to visit with family, however, room is dark and quiet; will visit later this morning.   Maternal Data    Feeding Mother's Current Feeding Choice: Breast Milk  LATCH Score Latch: Repeated attempts needed to sustain latch, nipple held in mouth throughout feeding, stimulation needed to elicit sucking reflex.  Audible Swallowing: Spontaneous and intermittent  Type of Nipple: Everted at rest and after stimulation  Comfort (Breast/Nipple): Filling, red/small blisters or bruises, mild/mod discomfort  Hold (Positioning): Full assist, staff holds infant at breast  LATCH Score: 6   Lactation Tools Discussed/Used    Interventions    Discharge    Consult Status Consult Status: Follow-up Date: 01/16/21 Follow-up type: In-patient    Dora Sims 01/16/2021, 6:35 AM

## 2021-01-16 NOTE — Lactation Note (Signed)
This note was copied from a baby's chart. Lactation Consultation Note  Patient Name: Cassie Church [id number] Date: 01/16/2021   Age:25 hours P1, term female infant. LC entered room in L&D and infant had finished breastfeeding for 10 minutes, RN assisted mom with latch. Mom had a lot of questions for [facility], mom attended online breastfeeding class at [facility]. LC discussed with mom infant primal hunger cues: licking, tasting ,rooting,  hands in mouth, kissing and mom BF infant STS.  Mom knows to ask RN or LC on MBU for assistance with latching infant at the breast if needed. LC discussed infant's input and out put with parents.  Maternal Data    Feeding    LATCH Score Latch: Repeated attempts needed to sustain latch, nipple held in mouth throughout feeding, stimulation needed to elicit sucking reflex.  Audible Swallowing: None  Type of Nipple: Everted at rest and after stimulation  Comfort (Breast/Nipple): Soft / non-tender  Hold (Positioning): Full assist, staff holds infant at breast  LATCH Score: 5   Lactation Tools Discussed/Used    Interventions Interventions: Skin to skin;Assisted with latch;Breast feeding basics reviewed;Adjust position;Support pillows  Discharge    Consult Status      Cassie Church 01/16/2021, 3:04 AM

## 2021-01-17 LAB — RPR: RPR Ser Ql: NONREACTIVE

## 2021-01-17 NOTE — Lactation Note (Signed)
This note was copied from a baby's chart. Lactation Consultation Note  Patient Name: Cassie Church Date: 01/17/2021 Reason for consult: Follow-up assessment;1st time breastfeeding;Primapara;Term Age:25 hours   LC Follow Up Visit:  Attempted to visit with mother, however, she is in the shower at this time.  Will need to review feeding plan with mother and to ask her to increase supplementation volumes, especially when not breast feeding at all.  Volumes should be 15-30 mls at this time.   Maternal Data    Feeding Mother's Current Feeding Choice: Breast Milk and Formula  LATCH Score                    Lactation Tools Discussed/Used    Interventions    Discharge    Consult Status Consult Status: Follow-up Date: 01/17/21 Follow-up type: In-patient    Mykia Holton R Petrea Fredenburg 01/17/2021, 6:09 PM

## 2021-01-17 NOTE — Lactation Note (Signed)
This note was copied from a baby's chart. Lactation Consultation Note  Patient Name: Cassie Church [id number] Date: 01/17/2021 Reason for consult: Follow-up assessment;Primapara;1st time breastfeeding;Term Age:25 hours  Visited with mom of 41 hours old FT female, she's a P1 and has started supplementing with Enfamil 20 calorie formula. Noticed that mom has been mainly formula feeding and she's not putting baby to breast consistently.   Reviewed supply/demand and instructed her to start increasing volumes for supplementation if baby is not going to breast. Supplementation guidelines, pumping schedule, normal newborn behavior, cluster feeding and feeding cues were reviewed.   Parents had questions about the types of formula and different nipples, re-directed them to bring those up the next time baby sees his pediatrician. Baby has been taking well extra slow flow nipples so far.  Feeding plan:  1. Encouraged mom to feed baby STS 8-12 times/24 hours or sooner if feeding cues are present 2. Pumping whenever baby is getting formula was also encouraged to protect her supply 3. Parents will continue supplementing baby with EBM/Enfamil formula according to formula supplementation guidelines per baby's age in hours  FOB present and supportive, he was involved and asking questions. Parents reported all questions and concerns were answered, they're both aware of LC OP services and will call PRN.   Maternal Data    Feeding Mother's Current Feeding Choice: Breast Milk and Formula Nipple Type: Extra Slow Flow  LATCH Score                    Lactation Tools Discussed/Used Tools: Pump Flange Size: 24 Breast pump type: Manual Pump Education: Setup, frequency, and cleaning;Milk Storage Reason for Pumping: induction of lactation Pumping frequency: q 3 hours Pumped volume:  (drops)  Interventions Interventions: Breast feeding basics reviewed;Hand pump  Discharge Pump:  Manual  Consult Status Consult Status: Follow-up Date: 01/18/21 Follow-up type: In-patient    Cassie Church 01/17/2021, 10:28 PM

## 2021-01-17 NOTE — Progress Notes (Signed)
POSTPARTUM PROGRESS NOTE  Subjective: Cassie Church is a 25 y.o. G1P1001 s/p SVD at [redacted]w[redacted]d.  She reports she doing well. No acute events overnight. She denies any problems with ambulating, voiding or po intake. Denies nausea or vomiting. Pain is well controlled.  Lochia is moderate.  Objective: Blood pressure 107/63, pulse 77, temperature 98.2 F (36.8 C), temperature source Oral, resp. rate 14, height 5\' 7"  (1.702 m), weight 84.8 kg, last menstrual period 04/18/2020, SpO2 99 %, unknown if currently breastfeeding.  Physical Exam:  General: alert, cooperative and no distress Chest: no respiratory distress Abdomen: soft, non-tender  Uterine Fundus: firm and at level of umbilicus Extremities: No calf swelling or tenderness  no edema  Recent Labs    01/15/21 1431  HGB 10.3*  HCT 33.5*    Assessment/Plan: Cassie Church is a 25 y.o. G1P1001 s/p SVD at [redacted]w[redacted]d for labor management s/p SROM at 1100 on 5/5.  Routine Postpartum Care: Doing well, pain well-controlled.  -- Continue routine care, lactation support  -- Contraception: Undecided -- Feeding: Breast  Dispo: Plan for discharge 5/8.  7/8, MD 01/17/2021 7:34 AM

## 2021-01-17 NOTE — Lactation Note (Signed)
This note was copied from a baby's chart. Lactation Consultation Note Baby 22 hrs old. Mom tearful worried about baby not feeding. Previous LC had NS and 5 fr feeding tube w/formula. Latched baby on breast. Baby wouldn't suckle. Resisted at first then just slept. Noted resp. 70 min. Mom holding baby STS.  Noted baby kept taking extra breaths frequently. Parents very tired but worried about sleeping d/t worried about baby. Suggested baby being watching in nursery for any respiratory issues while they sleep. Took baby to SCN.  Patient Name: Cassie Church [id number] Date: 01/17/2021 Reason for consult: Mother's request;Term;Primapara Age:39 hours  Maternal Data Has patient been taught Hand Expression?: Yes  Feeding Mother's Current Feeding Choice: Breast Milk  LATCH Score Latch: Too sleepy or reluctant, no latch achieved, no sucking elicited.  Audible Swallowing: None  Type of Nipple: Everted at rest and after stimulation  Comfort (Breast/Nipple): Filling, red/small blisters or bruises, mild/mod discomfort  Hold (Positioning): Assistance needed to correctly position infant at breast and maintain latch.  LATCH Score: 7   Lactation Tools Discussed/Used Tools: Pump;Flanges Flange Size: 24 Breast pump type: Manual Pump Education: Setup, frequency, and cleaning;Milk Storage Reason for Pumping: increase stimulation Pumping frequency: every 3 hrs for 10 minutes each breast  Interventions Interventions: Breast feeding basics reviewed;Support pillows;Education;Assisted with latch;Position options;Skin to skin;Expressed milk;Breast massage;Hand express;Hand pump;Pre-pump if needed;Adjust position;Breast compression;Comfort gels;Shells  Discharge Pump: Manual;Personal WIC Program: No  Consult Status Consult Status: Follow-up Date: 01/17/21 Follow-up type: In-patient    Charyl Dancer 01/17/2021, 12:15 AM

## 2021-01-18 MED ORDER — ACETAMINOPHEN 325 MG PO TABS: 650.0000 mg | ORAL_TABLET | ORAL | Status: DC | PRN

## 2021-01-18 MED ORDER — IBUPROFEN 100 MG/5ML PO SUSP: 600.0000 mg | Freq: Four times a day (QID) | ORAL | 0 refills | Status: DC

## 2021-01-18 MED ORDER — SIMETHICONE 80 MG PO CHEW: 80.0000 mg | CHEWABLE_TABLET | ORAL | 0 refills | Status: DC | PRN

## 2021-01-18 MED ORDER — COCONUT OIL OIL: 1.0000 "application " | TOPICAL_OIL | 0 refills | Status: DC | PRN

## 2021-01-18 MED ORDER — ACETAMINOPHEN 500 MG PO TABS: 1000.0000 mg | ORAL_TABLET | Freq: Four times a day (QID) | ORAL | Status: DC | PRN

## 2021-01-18 MED ORDER — FERROUS SULFATE 325 (65 FE) MG PO TABS: 325.0000 mg | ORAL_TABLET | ORAL | 3 refills | Status: DC

## 2021-01-18 NOTE — Lactation Note (Signed)
This note was copied from a baby's chart. Lactation Consultation Note  Patient Name: Cassie Church [id number] Date: 01/18/2021 Age:25 hours  Mom and baby are sleeping upon visit. LC will come back to room at another time as possible.     Shonnie Poudrier A Higuera Ancidey 01/18/2021, 9:09 AM

## 2021-01-18 NOTE — Lactation Note (Signed)
This note was copied from a baby's chart. Lactation Consultation Note  Patient Name: Cassie Church Date: 01/18/2021 Reason for consult: Follow-up assessment;Primapara;1st time breastfeeding;Term Age:25 hours  Follow up visit to 58 hours old with 3.44% weight loss of a P1 mother. Mother states she is getting ready to pump. LC noted mother is very engorged. Assisted with hand pump. Mother collected ~80mL pumping for 5 min each breast. LC offered assistance with latch since infant is showing hunger cues. Infant pops on and off breast. Mother also expresses discomfort with latch. Discussed pros and cons of a nipple shield. Parents agreed to try. Demonstrated placement and used curved-tip syringe to instill EBM. Infant latched with ease. Active suckles and swallows. Mother experiences cramping with feeding. Infant fell asleep after 6-minutes. LC changed stool diaper.  Mother expresses discomfort with engorged breast. Set up DEBP and mother collected ~44mL after 15 minutes pumping session. Taught milk storage, pumping frequency, and care of parts.  Reinforced attention to hunger and fullness cues as well as making feeding effective by keeping infant awake.  Feeding plan:  1. Breastfeed following hunger cues or 8 - 12 times in 24h period using nipple shield  2. Stimulate infant awake at the breast by massaging shoulders, arms, feet, etc 3. Pump for supplementation and stimulation  4. If needed supplement with EBM/ formula following guidelines, pace bottle feeding, frequent burping and, upright position. 5. Encouraged maternal rest, hydration and food intake.  6. Contact Lactation Services or local resources for support, questions or concerns.    All questions answered at this time. Family is waiting to be discharged home today.   Maternal Data Has patient been taught Hand Expression?: Yes Does the patient have breastfeeding experience prior to this delivery?: No  Feeding Mother's Current  Feeding Choice: Breast Milk  LATCH Score Latch: Grasps breast easily, tongue down, lips flanged, rhythmical sucking.  Audible Swallowing: Spontaneous and intermittent  Type of Nipple: Everted at rest and after stimulation (short shafted)  Comfort (Breast/Nipple): Filling, red/small blisters or bruises, mild/mod discomfort  Hold (Positioning): Assistance needed to correctly position infant at breast and maintain latch.  LATCH Score: 8   Lactation Tools Discussed/Used Tools: Pump;Flanges;Coconut oil;Nipple Shields Nipple shield size: 20 Flange Size: 27;24 Breast pump type: Double-Electric Breast Pump;Manual Pump Education: Setup, frequency, and cleaning;Milk Storage Reason for Pumping: engorgement Pumping frequency: as needed for relief Pumped volume: 60 mL  Interventions Interventions: Breast feeding basics reviewed;Assisted with latch;Skin to skin;Breast massage;Hand express;Pre-pump if needed;Adjust position;DEBP;Hand pump;Coconut oil;Expressed milk;Position options;Support pillows;Education  Discharge Discharge Education: Warning signs for feeding baby;Engorgement and breast care Pump: Personal;DEBP;Manual WIC Program: No  Consult Status Consult Status: Complete Date: 01/18/21 Follow-up type: Call as needed    Stephen Baruch A Higuera Ancidey 01/18/2021, 12:09 PM

## 2021-01-18 NOTE — Discharge Instructions (Signed)
-take tylenol 1000 mg every 6 hours as needed for pain, alternate with ibuprofen 600 mg every 6 hours -drink plenty of water to help with breastfeeding -continue prenatal vitamins while you are breastfeeding -think about birth control options-->bedisider.org is a great website! You can get any form of birth control from the health department for free   Postpartum Care After Vaginal Delivery The following information offers guidance about how to care for yourself from the time you deliver your baby to 6-12 weeks after delivery (postpartum period). If you have problems or questions, contact your health care provider for more specific instructions. Follow these instructions at home: Vaginal bleeding  It is normal to have vaginal bleeding (lochia) after delivery. Wear a sanitary pad for bleeding and discharge. ? During the first week after delivery, the amount and appearance of lochia is often similar to a menstrual period. ? Over the next few weeks, it will gradually decrease to a dry, yellow-brown discharge. ? For most women, lochia stops completely by 4-6 weeks after delivery, but can vary.  Change your sanitary pads frequently. Watch for any changes in your flow, such as: ? A sudden increase in volume. ? A change in color. ? Large blood clots.  If you pass a blood clot from your vagina, save it and call your health care provider. Do not flush blood clots down the toilet before talking with your health care provider.  Do not use tampons or douches until your health care provider approves.  If you are not breastfeeding, your period should return 6-8 weeks after delivery. If you are feeding your baby breast milk only, your period may not return until you stop breastfeeding. Perineal care  Keep the area between the vagina and the anus (perineum) clean and dry. Use medicated pads and pain-relieving sprays and creams as directed.  If you had a surgical cut in the perineum (episiotomy) or a  tear, check the area for signs of infection until you are healed. Check for: ? More redness, swelling, or pain. ? Fluid or blood coming from the cut or tear. ? Warmth. ? Pus or a bad smell.  You may be given a squirt bottle to use instead of wiping to clean the perineum area after you use the bathroom. Pat the area gently to dry it.  To relieve pain caused by an episiotomy, a tear, or swollen veins in the anus (hemorrhoids), take a warm sitz bath 2-3 times a day. In a sitz bath, the warm water should only come up to your hips and cover your buttocks.   Breast care  In the first few days after delivery, your breasts may feel heavy, full, and uncomfortable (breast engorgement). Milk may also leak from your breasts. Ask your health care provider about ways to help relieve the discomfort.  If you are breastfeeding: ? Wear a bra that supports your breasts and fits well. Use breast pads to absorb milk that leaks. ? Keep your nipples clean and dry. Apply creams and ointments as told. ? You may have uterine contractions every time you breastfeed for up to several weeks after delivery. This helps your uterus return to its normal size. ? If you have any problems with breastfeeding, notify your health care provider or lactation consultant.  If you are not breastfeeding: ? Avoid touching your breasts. Do not squeeze out (express) milk. Doing this can make your breasts produce more milk. ? Wear a good-fitting bra and use cold packs to help with swelling. Intimacy   and sexuality  Ask your health care provider when you can engage in sexual activity. This may depend upon: ? Your risk of infection. ? How fast you are healing. ? Your comfort and desire to engage in sexual activity.  You are able to get pregnant after delivery, even if you have not had your period. Talk with your health care provider about methods of birth control (contraception) or family planning if you desire future  pregnancies. Medicines  Take over-the-counter and prescription medicines only as told by your health care provider.  Take an over-the-counter stool softener to help ease bowel movements as told by your health care provider.  If you were prescribed an antibiotic medicine, take it as told by your health care provider. Do not stop taking the antibiotic even if you start to feel better.  Review all previous and current prescriptions to check for possible transfer into breast milk. Activity  Gradually return to your normal activities as told by your health care provider.  Rest as much as possible. Nap while your baby is sleeping. Eating and drinking  Drink enough fluid to keep your urine pale yellow.  To help prevent or relieve constipation, eat high-fiber foods every day.  Choose healthy eating to support breastfeeding or weight loss goals.  Take your prenatal vitamins until your health care provider tells you to stop.   General tips/recommendations  Do not use any products that contain nicotine or tobacco. These products include cigarettes, chewing tobacco, and vaping devices, such as e-cigarettes. If you need help quitting, ask your health care provider.  Do not drink alcohol, especially if you are breastfeeding.  Do not take medications or drugs that are not prescribed to you, especially if you are breastfeeding.  Visit your health care provider for a postpartum checkup within the first 3-6 weeks after delivery.  Complete a comprehensive postpartum visit no later than 12 weeks after delivery.  Keep all follow-up visits for you and your baby. Contact a health care provider if:  You feel unusually sad or worried.  Your breasts become red, painful, or hard.  You have a fever or other signs of an infection.  You have bleeding that is soaking through one pad an hour or you have blood clots.  You have a severe headache that doesn't go away or you have vision changes.  You  have nausea and vomiting and are unable to eat or drink anything for 24 hours. Get help right away if:  You have chest pain or difficulty breathing.  You have sudden, severe leg pain.  You faint or have a seizure.  You have thoughts about hurting yourself or your baby. If you ever feel like you may hurt yourself or others, or have thoughts about taking your own life, get help right away. Go to your nearest emergency department or:  Call your local emergency services (911 in the [place]  The National Suicide Prevention Lifeline at 1-800-273-8255. This suicide crisis helpline is open 24 hours a day.  Text the Crisis Text Line at 741741 (in the [place] Summary  The period of time after you deliver your newborn up to 6-12 weeks after delivery is called the postpartum period.  Keep all follow-up visits for you and your baby.  Review all previous and current prescriptions to check for possible transfer into breast milk.  Contact a health care provider if you feel unusually sad or worried during the postpartum period. This information is not intended to replace advice given to   you by your health care provider. Make sure you discuss any questions you have with your health care provider. Document Revised: 05/15/2020 Document Reviewed: 05/15/2020 Elsevier Patient Education  2021 [employer]vier Inc.  

## 2021-01-22 ENCOUNTER — Encounter: Payer: 59 | Admitting: Obstetrics and Gynecology

## 2021-01-23 ENCOUNTER — Inpatient Hospital Stay (HOSPITAL_COMMUNITY): Admit: 2021-01-23 | Payer: Self-pay

## 2021-02-03 ENCOUNTER — Other Ambulatory Visit: Payer: Self-pay

## 2021-02-03 ENCOUNTER — Telehealth: Payer: Self-pay

## 2021-02-03 ENCOUNTER — Encounter (HOSPITAL_COMMUNITY): Payer: Self-pay | Admitting: Obstetrics & Gynecology

## 2021-02-03 ENCOUNTER — Inpatient Hospital Stay (HOSPITAL_COMMUNITY)
Admission: AD | Admit: 2021-02-03 | Discharge: 2021-02-05 | DRG: 776 | Disposition: A | Discharge status: Home / Self Care | Payer: 59 | Attending: Obstetrics and Gynecology | Admitting: Obstetrics and Gynecology

## 2021-02-03 DIAGNOSIS — Z20822 Contact with and (suspected) exposure to covid-19: Secondary | ICD-10-CM | POA: Diagnosis present

## 2021-02-03 DIAGNOSIS — O1495 Unspecified pre-eclampsia, complicating the puerperium: Secondary | ICD-10-CM | POA: Diagnosis present

## 2021-02-03 DIAGNOSIS — D563 Thalassemia minor: Secondary | ICD-10-CM | POA: Diagnosis present

## 2021-02-03 DIAGNOSIS — R030 Elevated blood-pressure reading, without diagnosis of hypertension: Secondary | ICD-10-CM | POA: Diagnosis not present

## 2021-02-03 LAB — URINALYSIS, ROUTINE W REFLEX MICROSCOPIC
Bilirubin Urine: NEGATIVE
Glucose, UA: NEGATIVE mg/dL
Ketones, ur: NEGATIVE mg/dL
Nitrite: NEGATIVE
Protein, ur: 30 mg/dL — AB
Specific Gravity, Urine: 1.013 (ref 1.005–1.030)
pH: 5 (ref 5.0–8.0)

## 2021-02-03 LAB — RESP PANEL BY RT-PCR (FLU A&B, COVID) ARPGX2
Influenza A by PCR: NEGATIVE
Influenza B by PCR: NEGATIVE
SARS Coronavirus 2 by RT PCR: NEGATIVE

## 2021-02-03 LAB — CBC
HCT: 37.6 % (ref 36.0–46.0)
Hemoglobin: 11.6 g/dL — ABNORMAL LOW (ref 12.0–15.0)
MCH: 24.4 pg — ABNORMAL LOW (ref 26.0–34.0)
MCHC: 30.9 g/dL (ref 30.0–36.0)
MCV: 79 fL — ABNORMAL LOW (ref 80.0–100.0)
Platelets: 207 10*3/uL (ref 150–400)
RBC: 4.76 MIL/uL (ref 3.87–5.11)
RDW: 18.3 % — ABNORMAL HIGH (ref 11.5–15.5)
WBC: 3.7 10*3/uL — ABNORMAL LOW (ref 4.0–10.5)
nRBC: 0 % (ref 0.0–0.2)

## 2021-02-03 LAB — COMPREHENSIVE METABOLIC PANEL
ALT: 34 U/L (ref 0–44)
AST: 21 U/L (ref 15–41)
Albumin: 3.6 g/dL (ref 3.5–5.0)
Alkaline Phosphatase: 102 U/L (ref 38–126)
Anion gap: 7 (ref 5–15)
BUN: 17 mg/dL (ref 6–20)
CO2: 27 mmol/L (ref 22–32)
Calcium: 9.2 mg/dL (ref 8.9–10.3)
Chloride: 107 mmol/L (ref 98–111)
Creatinine, Ser: 0.95 mg/dL (ref 0.44–1.00)
GFR, Estimated: >60 mL/min (ref >60)
Glucose, Bld: 107 mg/dL — ABNORMAL HIGH (ref 70–99)
Potassium: 3.6 mmol/L (ref 3.5–5.1)
Sodium: 141 mmol/L (ref 135–145)
Total Bilirubin: 0.7 mg/dL (ref 0.3–1.2)
Total Protein: 6.8 g/dL (ref 6.5–8.1)

## 2021-02-03 MED ORDER — LACTATED RINGERS IV SOLN: INTRAVENOUS | Status: DC

## 2021-02-03 MED ORDER — NIFEDIPINE ER OSMOTIC RELEASE 30 MG PO TB24
30.0000 mg | ORAL_TABLET | Freq: Once | ORAL | Status: AC
  Administered 2021-02-03: 30 mg via ORAL
  Filled 2021-02-03: qty 1

## 2021-02-03 MED ORDER — LABETALOL HCL 5 MG/ML IV SOLN
40.0000 mg | INTRAVENOUS | Status: DC | PRN
  Administered 2021-02-03: 40 mg via INTRAVENOUS
  Filled 2021-02-03: qty 8

## 2021-02-03 MED ORDER — BUTALBITAL-APAP-CAFFEINE 50-325-40 MG PO TABS
1.0000 | ORAL_TABLET | Freq: Four times a day (QID) | ORAL | Status: DC | PRN
  Administered 2021-02-03: 1 via ORAL
  Administered 2021-02-04: 2 via ORAL
  Administered 2021-02-04: 1 via ORAL
  Filled 2021-02-03 (×2): qty 1
  Filled 2021-02-03: qty 2

## 2021-02-03 MED ORDER — LABETALOL HCL 5 MG/ML IV SOLN: 80.0000 mg | INTRAVENOUS | Status: DC | PRN

## 2021-02-03 MED ORDER — LABETALOL HCL 5 MG/ML IV SOLN
20.0000 mg | INTRAVENOUS | Status: DC | PRN
  Administered 2021-02-03: 20 mg via INTRAVENOUS
  Filled 2021-02-03: qty 4

## 2021-02-03 MED ORDER — LABETALOL HCL 5 MG/ML IV SOLN: 20.0000 mg | INTRAVENOUS | Status: DC | PRN

## 2021-02-03 MED ORDER — HYDRALAZINE HCL 20 MG/ML IJ SOLN: 10.0000 mg | INTRAMUSCULAR | Status: DC | PRN

## 2021-02-03 MED ORDER — MAGNESIUM SULFATE BOLUS VIA INFUSION
4.0000 g | Freq: Once | INTRAVENOUS | Status: AC
  Administered 2021-02-03: 4 g via INTRAVENOUS
  Filled 2021-02-03: qty 1000

## 2021-02-03 MED ORDER — MAGNESIUM SULFATE 40 GM/1000ML IV SOLN
2.0000 g/h | INTRAVENOUS | Status: AC
  Administered 2021-02-03 – 2021-02-04 (×2): 2 g/h via INTRAVENOUS
  Filled 2021-02-03 (×2): qty 1000

## 2021-02-03 MED ORDER — BUTALBITAL-APAP-CAFFEINE 50-325-40 MG PO TABS
1.0000 | ORAL_TABLET | Freq: Once | ORAL | Status: AC
  Administered 2021-02-03: 1 via ORAL
  Filled 2021-02-03: qty 1

## 2021-02-03 MED ORDER — LABETALOL HCL 5 MG/ML IV SOLN: 40.0000 mg | INTRAVENOUS | Status: DC | PRN

## 2021-02-03 NOTE — MAU Note (Signed)
...  Cassie Church is a 25 y.o. at Unknown here in MAU reporting: "The past week at least I have had a nagging headache." She states she tookTylenol on Friday, 01/30/2021 when it got bad enough and states she usually doesn't like to take medications. She states she drinks water consistently and always eats when she gets HA's. States she has noticed swelling in her lower legs that she reports is better than when she first left the hospital but states she has occasional pain when crossing her legs.   She states she had an in-person nurse visit at home and her BP was 156/110. States she last took her BP at 1330 and it was 145/99.    Pain score:  4/10 HA  Labs placed from triage: UA

## 2021-02-03 NOTE — Telephone Encounter (Signed)
Return call to pt after [facility] nurse called w/ eelvated B/P reading of 156/110.pt notes swelling in legs, and HA's No visual changes not on any B/P Rx at this time Has not tried OTC Rx for headache. Had pt to recheck B/P on the phone reading was 145/99. Consulted w/Dr.Constant pt needs to go to MAU for evaluation. Pt notified to report to MAU pt voiced understanding.

## 2021-02-03 NOTE — H&P (Signed)
OBSTETRIC ADMISSION HISTORY AND PHYSICAL  Cassie Church is a 25 y.o. G1P1 s/p SVD 2 weeks ago sent from office for elevated BP and HA. BP today 156/110. Reports onset of frontal HA 1 week ago. She has tried Tylenol which helped but HA returned. Denies visual disturbances, RUQ pain, SOB, and CP. Also reports ankle swelling but has improved since delivery. After evaluation in MAU she did not have relief from HA after meds and also severe range BPs. She was treated with IV Labetalol.  Prenatal History/Complications: - anemia - alpha thal carrier  Past Medical History: Past Medical History:  Diagnosis Date  . Herpes simplex virus (HSV) infection     Past Surgical History: Past Surgical History:  Procedure Laterality Date  . WISDOM TOOTH EXTRACTION Bilateral 2014    Obstetrical History: OB History    Gravida  1   Para  1   Term  1   Preterm  0   AB  0   Living  1     SAB  0   IAB  0   Ectopic  0   Multiple  0   Live Births  1           Social History: Social History   Socioeconomic History  . Marital status: Single    Spouse name: Not on file  . Number of children: Not on file  . Years of education: Not on file  . Highest education level: Not on file  Occupational History  . Not on file  Tobacco Use  . Smoking status: Never Smoker  . Smokeless tobacco: Never Used  Vaping Use  . Vaping Use: Never used  Substance and Sexual Activity  . Alcohol use: Not Currently    Comment: socially   . Drug use: No  . Sexual activity: Not Currently    Partners: Male    Birth control/protection: None  Other Topics Concern  . Not on file  Social History Narrative  . Not on file   Social Determinants of Health   Financial Resource Strain: Not on file  Food Insecurity: Not on file  Transportation Needs: Not on file  Physical Activity: Not on file  Stress: Not on file  Social Connections: Not on file    Family History: Family History  Problem Relation Age  of Onset  . Hyperlipidemia Father   . Hypertension Father   . Diabetes Paternal Grandfather     Allergies: No Known Allergies  Medications Prior to Admission  Medication Sig Dispense Refill Last Dose  . Prenatal MV & Min w/FA-DHA (PRENATAL GUMMIES PO) Take by mouth.   02/03/2021 at Unknown time  . acetaminophen (TYLENOL) 500 MG tablet Take 2 tablets (1,000 mg total) by mouth every 6 (six) hours as needed (for pain scale < 4).     . Blood Pressure Monitor KIT 1 Device by Does not apply route once a week. To be monitored Regularly at home. 1 kit 0   . coconut oil OIL Apply 1 application topically as needed.  0   . ibuprofen (ADVIL) 100 MG/5ML suspension Take 30 mLs (600 mg total) by mouth every 6 (six) hours. 473 mL 0      Review of Systems:  All systems reviewed and negative except as stated in HPI  PE: Blood pressure (!) 159/100, pulse 60, temperature 98.3 F (36.8 C), temperature source Oral, SpO2 100 %, unknown if currently breastfeeding. Patient Vitals for the past 24 hrs:  BP Temp Temp  src Pulse SpO2  02/03/21 1716 (!) 164/98 -- -- (!) 58 --  02/03/21 1701 (!) 159/100 -- -- 60 100 %  02/03/21 1647 (!) 154/99 -- -- 64 100 %  02/03/21 1646 (!) 162/101 -- -- 69 --  02/03/21 1631 (!) 154/101 -- -- 67 100 %  02/03/21 1616 (!) 157/98 -- -- 60 --  02/03/21 1546 (!) 142/99 -- -- 77 100 %  02/03/21 1526 (!) 142/95 -- -- 68 99 %  02/03/21 1513 (!) 144/102 98.3 F (36.8 C) Oral 78 100 %   General appearance: alert, cooperative and no distress Lungs: regular rate and effort Heart: regular rate  Extremities: Homans sign is negative, no sign of DVT; trace edema BLE Neuro:    General: No focal deficit present.     Mental Status: She is alert and oriented to person, place, and time.     Cranial Nerves: No cranial nerve deficit.   Prenatal labs: ABO, Rh: --/--/A POS (05/05 1510) Antibody: NEG (05/05 1510) Rubella: 3.17 (10/20 1107) RPR: NON REACTIVE (05/06 1115)  HBsAg:  Negative (10/20 1107)  HIV: Non Reactive (02/09 1032)  GBS: Positive/-- (04/20 1124)   Results for orders placed or performed during the hospital encounter of 02/03/21 (from the past 24 hour(s))  Urinalysis, Routine w reflex microscopic Urine, Clean Catch   Collection Time: 02/03/21  2:59 PM  Result Value Ref Range   Color, Urine YELLOW YELLOW   APPearance HAZY (A) CLEAR   Specific Gravity, Urine 1.013 1.005 - 1.030   pH 5.0 5.0 - 8.0   Glucose, UA NEGATIVE NEGATIVE mg/dL   Hgb urine dipstick MODERATE (A) NEGATIVE   Bilirubin Urine NEGATIVE NEGATIVE   Ketones, ur NEGATIVE NEGATIVE mg/dL   Protein, ur 30 (A) NEGATIVE mg/dL   Nitrite NEGATIVE NEGATIVE   Leukocytes,Ua LARGE (A) NEGATIVE   RBC / HPF 21-50 0 - 5 RBC/hpf   WBC, UA 11-20 0 - 5 WBC/hpf   Bacteria, UA RARE (A) NONE SEEN   Squamous Epithelial / LPF 0-5 0 - 5   Mucus PRESENT   CBC   Collection Time: 02/03/21  3:46 PM  Result Value Ref Range   WBC 3.7 (L) 4.0 - 10.5 K/uL   RBC 4.76 3.87 - 5.11 MIL/uL   Hemoglobin 11.6 (L) 12.0 - 15.0 g/dL   HCT 37.6 36.0 - 46.0 %   MCV 79.0 (L) 80.0 - 100.0 fL   MCH 24.4 (L) 26.0 - 34.0 pg   MCHC 30.9 30.0 - 36.0 g/dL   RDW 18.3 (H) 11.5 - 15.5 %   Platelets 207 150 - 400 K/uL   nRBC 0.0 0.0 - 0.2 %  Comprehensive metabolic panel   Collection Time: 02/03/21  3:46 PM  Result Value Ref Range   Sodium 141 135 - 145 mmol/L   Potassium 3.6 3.5 - 5.1 mmol/L   Chloride 107 98 - 111 mmol/L   CO2 27 22 - 32 mmol/L   Glucose, Bld 107 (H) 70 - 99 mg/dL   BUN 17 6 - 20 mg/dL   Creatinine, Ser 0.95 0.44 - 1.00 mg/dL   Calcium 9.2 8.9 - 10.3 mg/dL   Total Protein 6.8 6.5 - 8.1 g/dL   Albumin 3.6 3.5 - 5.0 g/dL   AST 21 15 - 41 U/L   ALT 34 0 - 44 U/L   Alkaline Phosphatase 102 38 - 126 U/L   Total Bilirubin 0.7 0.3 - 1.2 mg/dL   GFR, Estimated >60 >60 mL/min  Anion gap 7 5 - 15    Patient Active Problem List   Diagnosis Date Noted  . Preeclampsia in postpartum period 02/03/2021   . PROM (premature rupture of membranes) 01/16/2021  . Postpartum care following vaginal delivery 5/6 01/16/2021  . Perineal laceration, second degree 01/16/2021  . Group B Streptococcus carrier, +RV culture, currently pregnant 01/07/2021  . Anemia in pregnancy 10/24/2020  . Alpha thalassemia silent carrier 07/14/2020  . Supervision of normal pregnancy, antepartum 05/28/2020   Assessment: Postpartum PEC w/severe features   Plan: Admit to [facility] unit MgSO4 Procardia Mngt per Dr. Eda Keys, CNM  02/03/2021, 5:25 PM

## 2021-02-04 DIAGNOSIS — O1495 Unspecified pre-eclampsia, complicating the puerperium: Principal | ICD-10-CM

## 2021-02-04 NOTE — Progress Notes (Signed)
POSTPARTUM PROGRESS NOTE  PPD#19- readmit for postpartum preeclampsia  Subjective:  Cassie Church is a 25 y.o. G1P1001 admitted due to postpartum preeclampsia.  Resting comfortably this am.  Initially noted headache, now improved s/p Fioricet.  Rates her pain 4/10.  Denies blurry vision.  She denies any problems with ambulating, voiding or po intake. Denies nausea or vomiting. Last BM about 2 days age  75 appropriate Denies fever/chills/chest pain/SOB.   Objective: Blood pressure (!) 142/98, pulse 79, temperature 97.6 F (36.4 C), temperature source Oral, resp. rate 16, SpO2 100 %, unknown if currently breastfeeding.  Physical Exam:  General: alert, cooperative and no distress Chest: no respiratory distress Heart: regular rate and rhythm Abdomen: soft, nontender Uterine Fundus: firm, below umbilicus DVT Evaluation: No calf tenderness, SCDs in place Extremities: no edema Skin: warm, dry  Results for orders placed or performed during the hospital encounter of 02/03/21 (from the past 24 hour(s))  Urinalysis, Routine w reflex microscopic Urine, Clean Catch     Status: Abnormal   Collection Time: 02/03/21  2:59 PM  Result Value Ref Range   Color, Urine YELLOW YELLOW   APPearance HAZY (A) CLEAR   Specific Gravity, Urine 1.013 1.005 - 1.030   pH 5.0 5.0 - 8.0   Glucose, UA NEGATIVE NEGATIVE mg/dL   Hgb urine dipstick MODERATE (A) NEGATIVE   Bilirubin Urine NEGATIVE NEGATIVE   Ketones, ur NEGATIVE NEGATIVE mg/dL   Protein, ur 30 (A) NEGATIVE mg/dL   Nitrite NEGATIVE NEGATIVE   Leukocytes,Ua LARGE (A) NEGATIVE   RBC / HPF 21-50 0 - 5 RBC/hpf   WBC, UA 11-20 0 - 5 WBC/hpf   Bacteria, UA RARE (A) NONE SEEN   Squamous Epithelial / LPF 0-5 0 - 5   Mucus PRESENT   CBC     Status: Abnormal   Collection Time: 02/03/21  3:46 PM  Result Value Ref Range   WBC 3.7 (L) 4.0 - 10.5 K/uL   RBC 4.76 3.87 - 5.11 MIL/uL   Hemoglobin 11.6 (L) 12.0 - 15.0 g/dL   HCT 16.1 09.6 - 04.5 %   MCV  79.0 (L) 80.0 - 100.0 fL   MCH 24.4 (L) 26.0 - 34.0 pg   MCHC 30.9 30.0 - 36.0 g/dL   RDW 40.9 (H) 81.1 - 91.4 %   Platelets 207 150 - 400 K/uL   nRBC 0.0 0.0 - 0.2 %  Comprehensive metabolic panel     Status: Abnormal   Collection Time: 02/03/21  3:46 PM  Result Value Ref Range   Sodium 141 135 - 145 mmol/L   Potassium 3.6 3.5 - 5.1 mmol/L   Chloride 107 98 - 111 mmol/L   CO2 27 22 - 32 mmol/L   Glucose, Bld 107 (H) 70 - 99 mg/dL   BUN 17 6 - 20 mg/dL   Creatinine, Ser 7.82 0.44 - 1.00 mg/dL   Calcium 9.2 8.9 - 95.6 mg/dL   Total Protein 6.8 6.5 - 8.1 g/dL   Albumin 3.6 3.5 - 5.0 g/dL   AST 21 15 - 41 U/L   ALT 34 0 - 44 U/L   Alkaline Phosphatase 102 38 - 126 U/L   Total Bilirubin 0.7 0.3 - 1.2 mg/dL   GFR, Estimated >21 >30 mL/min   Anion gap 7 5 - 15  Resp Panel by RT-PCR (Flu A&B, Covid) Nasopharyngeal Swab     Status: None   Collection Time: 02/03/21  5:54 PM   Specimen: Nasopharyngeal Swab; Nasopharyngeal(NP) swabs in  vial transport medium  Result Value Ref Range   SARS Coronavirus 2 by RT PCR NEGATIVE NEGATIVE   Influenza A by PCR NEGATIVE NEGATIVE   Influenza B by PCR NEGATIVE NEGATIVE    Assessment/Plan: Cassie Church is a 25 y.o. G1P1001 admitted due to postpartum preeclampsia - currently on Mag x 24hr -BP management with Procardia XL 30mg  daily -will continue to closely monitor -meeting other postpartum milestones appropriately  Dispo: Continue Mag x 24hr then plan to monitor BPs, likely discharge home tomorrow- 5/26   LOS: 1 day   6/26, DO Faculty Attending, [facility] 02/04/2021, 10:12 AM

## 2021-02-04 NOTE — Discharge Instructions (Signed)
Postpartum Hypertension/Preeclampsia Postpartum hypertension is high blood pressure that is higher than normal after childbirth. It usually starts within 1 to 2 days after delivery, but it can happen at any time for up to 6 weeks after delivery. For some women, medical treatment is required to prevent serious complications, such as seizures or stroke. What are the causes? The cause of this condition is not well understood. In some cases, the cause may not be known. Certain conditions may increase your risk. These include:  Hypertension that existed before pregnancy (chronic hypertension).  Hypertension that comes as a result of pregnancy (gestational hypertension).  Hypertensive disorders during pregnancy or seizures in women who have high blood pressure during pregnancy. These conditions are called preeclampsia and eclampsia.  A condition in which the liver, platelets, and red blood cells are damaged during pregnancy (HELLP syndrome).  Obesity.  Diabetes. What are the signs or symptoms? As with all types of hypertension, postpartum hypertension may not have any symptoms. Depending on how high your blood pressure is, you may experience:  Headaches. These may be mild, moderate, or severe. They may also be steady, constant, or sudden in onset (thunderclap headache).  Vision changes, such as blurry vision, flashing lights, or seeing spots.  Nausea and vomiting.  Pain in the upper right side of your abdomen.  Shortness of breath.  Difficulty breathing while lying down.  A decrease in the amount of urine that you pass. How is this diagnosed? This condition may be diagnosed based on the results of a physical exam, blood pressure measurements, and blood and urine tests. You may also have other tests, such as a CT scan or an MRI, to check for other problems of postpartum hypertension. How is this treated? If blood pressure is high enough to require treatment, your options may  include:  Medicines to reduce blood pressure (antihypertensives). Tell your health care provider if you are breastfeeding or if you plan to breastfeed. There are many antihypertensive medicines that are safe to take while breastfeeding.  Treating medical conditions that are causing hypertension.  Treating the complications of hypertension, such as seizures, stroke, or kidney problems. Your health care provider will also continue to monitor your blood pressure closely until it is within a safe range for you. Follow these instructions at home: Learn your goal blood pressure Two numbers make up your blood pressure. The first number is called systolic pressure. The second is called diastolic pressure. An example of a blood pressure reading is "120 over 80" (or 120/80). For most people, goal blood pressure is:  First number: below 140.  Second number: below 90. Your blood pressure is above normal even if only the top or bottom number is above normal. Know what to do before you take your blood pressure 30 minutes before you check your blood pressure:  Do not drink caffeine.  Do not drink alcohol.  Avoid food and drink.  Do not smoke.  Do not exercise. 5 minutes before you check your blood pressure:  Use the bathroom and urinate so that you have an empty bladder.  Sit quietly in a dining room chair. Do not sit in a soft couch or an armchair. Do not talk. Know how to take your blood pressure To check your blood pressure, follow the instructions in the manual that came with your blood pressure monitor. If you have a digital blood pressure monitor, the instructions may be as follows: 1. Sit up straight. 2. Place your feet on the floor. Do  not cross your ankles or legs. 3. Rest your left arm at the level of your heart. You may rest it on a table, desk, or chair. 4. Pull up your shirt sleeve. 5. Wrap the blood pressure cuff around the upper part of your left arm. The cuff should be 1 inch  (2.5 cm) above your elbow. It is best to wrap the cuff around bare skin. 6. Fit the cuff snugly around your arm. You should be able to place only one finger between the cuff and your arm. 7. Put the cord inside the groove of your elbow. 8. Press the power button. 9. Sit quietly while the cuff fills with air and loses air. 10. Write down the numbers on the screen. These are your blood pressure readings. 11. Wait 1-2 minutes and then repeat steps 1-10.    General instructions  Take over-the-counter and prescription medicines only as told by your health care provider.  Do not use any products that contain nicotine or tobacco. These products include cigarettes, chewing tobacco, and vaping devices, such as e-cigarettes. If you need help quitting, ask your health care provider.  Check your blood pressure as often as recommended by your health care provider.  Return to your normal activities as told by your health care provider. Ask your health care provider what activities are safe for you.  Keep all follow-up visits. This is important. Contact a health care provider if:  You have new symptoms, such as: ? A headache that does not get better. ? Dizziness. ? Visual changes. ? Nausea and vomiting. Get help right away if:  You develop difficulty breathing.  You have chest pain.  You faint.  You have any symptoms of a stroke. "BE FAST" is an easy way to remember the main warning signs of a stroke: ? B - Balance. Signs are dizziness, sudden trouble walking, or loss of balance. ? E - Eyes. Signs are trouble seeing or a sudden change in vision. ? F - Face. Signs are sudden weakness or numbness of the face, or the face or eyelid drooping on one side. ? A - Arms. Signs are weakness or numbness in an arm. This happens suddenly and usually on one side of the body. ? S - Speech. Signs are sudden trouble speaking, slurred speech, or trouble understanding what people say. ? T - Time. Time to call  emergency services. Write down what time symptoms started.  You have other signs of a stroke, such as: ? A sudden, severe headache with no known cause. ? Nausea or vomiting. ? Seizure. These symptoms may represent a serious problem that is an emergency. Do not wait to see if the symptoms will go away. Get medical help right away. Call your local emergency services (911 in the [place] Do not drive yourself to the hospital. Summary  Postpartum hypertension is high blood pressure that remains higher than normal after childbirth.  For some women, medical treatment is required to prevent serious complications, such as seizures or stroke.  Follow your health care provider's instructions on how to record your blood pressure readings.  Keep all follow-up visits. This is important. This information is not intended to replace advice given to you by your health care provider. Make sure you discuss any questions you have with your health care provider. Document Revised: 05/27/2020 Document Reviewed: 05/27/2020 Elsevier Patient Education  2021 [employer].

## 2021-02-05 ENCOUNTER — Other Ambulatory Visit (HOSPITAL_COMMUNITY): Payer: Self-pay

## 2021-02-05 MED ORDER — NIFEDIPINE ER OSMOTIC RELEASE 30 MG PO TB24
30.0000 mg | ORAL_TABLET | Freq: Every day | ORAL | Status: DC
  Administered 2021-02-05: 30 mg via ORAL
  Filled 2021-02-05: qty 1

## 2021-02-05 MED ORDER — NIFEDIPINE ER 30 MG PO TB24
30.0000 mg | ORAL_TABLET | Freq: Every day | ORAL | 0 refills | Status: DC
  Filled 2021-02-05: qty 30, 30d supply, fill #0

## 2021-02-05 NOTE — Progress Notes (Signed)
Pt given discharge instructions and pt verbalized understanding. All questions answered. IV discontinued. Pt set up with [employer]. Pt provided return demonstration utilizing the app. Pt discharged in stable condition with all belongings.

## 2021-02-05 NOTE — Discharge Summary (Signed)
Physician Discharge Summary  Patient ID: Cassie Church MRN: [id number] DOB/AGE: 03-19-1996 25 y.o.  Admit date: 02/03/2021 Discharge date: 02/05/2021  Admission Diagnoses: Preeclampsia in postpartum period  Discharge Diagnoses:  Active Problems:   Preeclampsia in postpartum period   Discharged Condition: stable  Hospital Course: 25yo G1P1 admitted due to postpartum preeclampsia.  Pt treated with IV Magensium x 24hr and procardia 31m daily. Headache noted to improve and BP noted to be within normal limits.  She was discharged home in stable condition with plans for outpatient follow up.  Consults: None  Significant Diagnostic Studies: labs: Results for Cassie Church, Cassie Church(MRN [id number] as of 02/05/2021 06:53  Ref. Range 02/03/2021 15:46  COMPREHENSIVE METABOLIC PANEL Unknown Rpt (A)  Sodium Latest Ref Range: 135 - 145 mmol/L 141  Potassium Latest Ref Range: 3.5 - 5.1 mmol/L 3.6  Chloride Latest Ref Range: 98 - 111 mmol/L 107  CO2 Latest Ref Range: 22 - 32 mmol/L 27  Glucose Latest Ref Range: 70 - 99 mg/dL 107 (H)  BUN Latest Ref Range: 6 - 20 mg/dL 17  Creatinine Latest Ref Range: 0.44 - 1.00 mg/dL 0.95  Calcium Latest Ref Range: 8.9 - 10.3 mg/dL 9.2  Anion gap Latest Ref Range: 5 - 15  7  Alkaline Phosphatase Latest Ref Range: 38 - 126 U/L 102  Albumin Latest Ref Range: 3.5 - 5.0 g/dL 3.6  AST Latest Ref Range: 15 - 41 U/L 21  ALT Latest Ref Range: 0 - 44 U/L 34  Total Protein Latest Ref Range: 6.5 - 8.1 g/dL 6.8  Total Bilirubin Latest Ref Range: 0.3 - 1.2 mg/dL 0.7  GFR, Estimated Latest Ref Range: >60 mL/min >60  WBC Latest Ref Range: 4.0 - 10.5 K/uL 3.7 (L)  RBC Latest Ref Range: 3.87 - 5.11 MIL/uL 4.76  Hemoglobin Latest Ref Range: 12.0 - 15.0 g/dL 11.6 (L)  HCT Latest Ref Range: 36.0 - 46.0 % 37.6  MCV Latest Ref Range: 80.0 - 100.0 fL 79.0 (L)  MCH Latest Ref Range: 26.0 - 34.0 pg 24.4 (L)  MCHC Latest Ref Range: 30.0 - 36.0 g/dL 30.9  RDW Latest Ref Range: 11.5 - 15.5  % 18.3 (H)  Platelets Latest Ref Range: 150 - 400 K/uL 207  nRBC Latest Ref Range: 0.0 - 0.2 % 0.0    Treatments: IV Magnesium, anti-hypertensives  Discharge Exam: Blood pressure 131/65, pulse 72, temperature 98.3 F (36.8 C), temperature source Oral, resp. rate 16, SpO2 98 %, unknown if currently breastfeeding. General appearance: alert, cooperative and no distress Resp: clear to auscultation bilaterally Cardio: regular rate and rhythm GI: soft and non-tender, uterus below umbilicus Extremities: edema absent, no calf tenderness bilaterally Skin: warm and dry Neurologic: Grossly normal  Disposition: Discharge disposition: 01-Home or Self Care       Discharge Instructions    Discharge patient   Complete by: As directed    Discharge disposition: 01-Home or Self Care   Discharge patient date: 02/05/2021     Allergies as of 02/05/2021   No Known Allergies     Medication List    TAKE these medications   acetaminophen 500 MG tablet Commonly known as: TYLENOL Take 2 tablets (1,000 mg total) by mouth every 6 (six) hours as needed (for pain scale < 4).   Blood Pressure Monitor Kit 1 Device by Does not apply route once a week. To be monitored Regularly at home.   coconut oil Oil Apply 1 application topically as needed.   ibuprofen 100 MG/5ML  suspension Commonly known as: ADVIL Take 30 mLs (600 mg total) by mouth every 6 (six) hours.   NIFEdipine 30 MG 24 hr tablet Commonly known as: ADALAT CC Take 1 tablet (30 mg total) by mouth daily.   PRENATAL GUMMIES PO Take by mouth.       Follow-up Information    [facility] Follow up in 1 week(s).   Specialty: Obstetrics and Gynecology Contact information: [address], [place] [address] [phone]              Signed: Annalee Genta 02/05/2021, 6:52 AM

## 2021-02-16 ENCOUNTER — Telehealth: Payer: Self-pay | Admitting: Family Medicine

## 2021-02-16 ENCOUNTER — Other Ambulatory Visit: Payer: Self-pay | Admitting: Family Medicine

## 2021-02-16 ENCOUNTER — Ambulatory Visit: Payer: 59 | Admitting: Advanced Practice Midwife

## 2021-02-16 MED ORDER — NIFEDIPINE ER 30 MG PO TB24: 60.0000 mg | ORAL_TABLET | Freq: Every day | ORAL | 0 refills | Status: DC

## 2021-02-16 NOTE — Telephone Encounter (Signed)
BP elevated x 2 and notified via [employer] monitoring. Increase procardia to 60 mg--call was unanswered. Sent MyChart message instead.

## 2021-02-17 ENCOUNTER — Other Ambulatory Visit: Payer: Self-pay

## 2021-02-17 ENCOUNTER — Encounter: Payer: Self-pay | Admitting: Obstetrics & Gynecology

## 2021-02-17 ENCOUNTER — Ambulatory Visit (INDEPENDENT_AMBULATORY_CARE_PROVIDER_SITE_OTHER): Payer: 59 | Admitting: Obstetrics & Gynecology

## 2021-02-17 DIAGNOSIS — O1495 Unspecified pre-eclampsia, complicating the puerperium: Secondary | ICD-10-CM

## 2021-02-17 DIAGNOSIS — I890 Lymphedema, not elsewhere classified: Secondary | ICD-10-CM

## 2021-02-17 MED ORDER — LABETALOL HCL 100 MG PO TABS: 100.0000 mg | ORAL_TABLET | Freq: Two times a day (BID) | ORAL | 2 refills | Status: DC

## 2021-02-17 NOTE — Patient Instructions (Signed)
Colace/docusate sodium 100mg .  Ok to take 4 times a day.  Senakot is another option.  Miralax can taken for more severe constipation.

## 2021-02-17 NOTE — Progress Notes (Signed)
Reports severe migraine when taking nifedipine. Only taking when BP elevated. Would like alternate med.

## 2021-02-17 NOTE — Progress Notes (Signed)
Post Partum Visit Note  Cassie Church is a 25 y.o. [id number] female who presents for a postpartum visit. She is 4 weeks postpartum following a normal spontaneous vaginal delivery.  I have fully reviewed the prenatal and intrapartum course. The delivery was at 39 gestational weeks.  Anesthesia: IV sedation.  Postpartum course has been complicated by preelampsia. Baby is doing well. Baby is feeding by both breast and bottle - Enfamil AR. Bleeding staining only.  Bowel function is abnormal: constipation. Bladder function is normal. Patient is sexually active. Contraception method is none. Postpartum depression screening: negative.   The pregnancy intention screening data noted above was reviewed. Potential methods of contraception were discussed. The patient elected to proceed with Hormonal Implant.  Was SA last night without contraception.  Is aware will need to abstain from SA for two weeks and then have negative pregnancy test prior to placement of nexplanon.   Edinburgh Postnatal Depression Scale - 02/17/21 1333      Edinburgh Postnatal Depression Scale:  In the Past 7 Days   I have been able to laugh and see the funny side of things. 0    I have looked forward with enjoyment to things. 0    I have blamed myself unnecessarily when things went wrong. 2    I have been anxious or worried for no good reason. 1    I have felt scared or panicky for no good reason. 0    Things have been getting on top of me. 1    I have been so unhappy that I have had difficulty sleeping. 0    I have felt sad or miserable. 1    I have been so unhappy that I have been crying. 1    The thought of harming myself has occurred to me. 0    Edinburgh Postnatal Depression Scale Total 6           Health Maintenance Due  Topic Date Due  . HPV VACCINES (1 - 2-dose series) Never done  . COVID-19 Vaccine (3 - Booster for Pfizer series) 06/04/2020    The following portions of the patient's history were reviewed and  updated as appropriate: allergies, current medications, past family history, past medical history, past social history, past surgical history and problem list.  Review of Systems A comprehensive review of systems was negative except for: Neurological: positive for headaches when she takes the nifedapine.    Objective:  BP (!) 128/92   Pulse 79   Ht 5\' 6"  (1.676 m)   Wt 157 lb 12.8 oz (71.6 kg)   LMP 04/18/2020   BMI 25.47 kg/m    General:  alert, cooperative and no distress   Breasts:  normal  Lungs: clear to auscultation bilaterally  Heart:  regular rate and rhythm, S1, S2 normal, no murmur, click, rub or gallop  Abdomen: soft, non-tender; bowel sounds normal; no masses,  no organomegaly   Wound n/a  GU exam:  deferred       Assessment:    There are no diagnoses linked to this encounter.  Normal postpartum exam.   Plan:   Essential components of care per ACOG recommendations:  1.  Mood and well being: Patient with negative depression screening today. Reviewed local resources for support.  - Patient tobacco use? No.   - hx of drug use? No.    2. Infant care and feeding:  -Patient currently breastmilk feeding? yes -Social determinants of health (SDOH) reviewed in  EPIC. No concerns.  3. Sexuality, contraception and birth spacing - Patient does not want a pregnancy in the next year.  Desired family size is unsure.  - Reviewed forms of contraception in tiered fashion. Patient desired Nexplanon placement.  - Discussed birth spacing of 18 months  4. Sleep and fatigue -Encouraged family/partner/community support of 4 hrs of uninterrupted sleep to help with mood and fatigue  5. Physical Recovery  - Discussed patients delivery and complications. She describes her labor as good. - Patient had a Vaginal, no problems at delivery. Patient had a 2nd degree laceration. Perineal healing reviewed. Patient expressed understanding - Patient has urinary incontinence? No. - Patient is  safe to resume physical and sexual activity  6.  Health Maintenance - HM due items addressed Yes - Last pap smear  Diagnosis  Date Value Ref Range Status  07/02/2020 - Low grade squamous intraepithelial lesion (LSIL) (A)  Final   Pap smear not done at today's visit.  -Breast Cancer screening indicated? No.   7. Chronic Disease/Pregnancy Condition follow up: Hypertension  - change from current medication to labetalol 100mg  BID due ot side effects with current medication.  , MD [facility]

## 2021-03-05 ENCOUNTER — Encounter: Payer: Self-pay | Admitting: Obstetrics

## 2021-03-05 ENCOUNTER — Other Ambulatory Visit: Payer: Self-pay

## 2021-03-05 ENCOUNTER — Ambulatory Visit (INDEPENDENT_AMBULATORY_CARE_PROVIDER_SITE_OTHER): Payer: 59 | Admitting: Obstetrics

## 2021-03-05 VITALS — BP 128/83 | HR 91 | Ht 66.0 in | Wt 155.0 lb

## 2021-03-05 DIAGNOSIS — Z30017 Encounter for initial prescription of implantable subdermal contraceptive: Secondary | ICD-10-CM | POA: Diagnosis not present

## 2021-03-05 LAB — POCT URINE PREGNANCY: Preg Test, Ur: NEGATIVE

## 2021-03-05 MED ORDER — ETONOGESTREL 68 MG ~~LOC~~ IMPL
68.0000 mg | DRUG_IMPLANT | Freq: Once | SUBCUTANEOUS | Status: AC
  Administered 2021-03-05: 68 mg via SUBCUTANEOUS

## 2021-03-05 NOTE — Progress Notes (Signed)
Pt is in the office for nexplanon insertion.

## 2021-03-05 NOTE — Progress Notes (Signed)
Nexplanon Procedure Note   PRE-OP DIAGNOSIS: Desired Long-Acting, Reversible Contraception ( LARC ) POST-OP DIAGNOSIS: Same  PROCEDURE: Nexplanon  Placement Performing Provider: Brock Bad, MD  Patient education prior to procedure, explained risk, benefits of Nexplanon, reviewed alternative options. Patient reported understanding. Gave consent to continue with procedure.   PROCEDURE:  Pregnancy Text :  Negative Site (check):      left arm         Sterile Preparation:   Betadinex3 Lot # [id number] Expiration Date 2024 JUL 03  Insertion site was selected 8 - 10 cm from medial epicondyle and marked along with guiding site using sterile marker. Procedure area was prepped and draped in a sterile fashion. 1% Lidocaine 1.5 ml given prior to procedure. Nexplanon  was inserted subcutaneously.Needle was removed from the insertion site. Nexplanon capsule was palpated by provider and patient to assure satisfactory placement. Dressing applied.  Followup: The patient tolerated the procedure well without complications.  Standard post-procedure care is explained and return precautions are given.  Brock Bad, MD 03/05/2021 5:13 PM

## 2021-03-19 ENCOUNTER — Encounter: Payer: Self-pay | Admitting: Obstetrics

## 2021-03-19 ENCOUNTER — Other Ambulatory Visit: Payer: Self-pay

## 2021-03-19 ENCOUNTER — Ambulatory Visit (INDEPENDENT_AMBULATORY_CARE_PROVIDER_SITE_OTHER): Payer: 59 | Admitting: Obstetrics

## 2021-03-19 VITALS — BP 131/76 | HR 83 | Ht 66.0 in | Wt 157.3 lb

## 2021-03-19 DIAGNOSIS — Z3046 Encounter for surveillance of implantable subdermal contraceptive: Secondary | ICD-10-CM

## 2021-03-19 NOTE — Progress Notes (Signed)
Subjective:    Cassie Church is a 24 y.o. female who presents for follow-up after Nexplanon insertion. The patient has no complaints today. The patient is sexually active. Pertinent past medical history: none.  The information documented in the HPI was reviewed and verified.  Menstrual History: OB History     Gravida  1   Para  1   Term  1   Preterm  0   AB  0   Living  1      SAB  0   IAB  0   Ectopic  0   Multiple  0   Live Births  1            Patient's last menstrual period was 02/24/2021.   Patient Active Problem List   Diagnosis Date Noted   Preeclampsia in postpartum period 02/03/2021   Anemia in pregnancy 10/24/2020   Alpha thalassemia silent carrier 07/14/2020   Past Medical History:  Diagnosis Date   Herpes simplex virus (HSV) infection     Past Surgical History:  Procedure Laterality Date   WISDOM TOOTH EXTRACTION Bilateral 2014     Current Outpatient Medications:    acetaminophen (TYLENOL) 500 MG tablet, Take 2 tablets (1,000 mg total) by mouth every 6 (six) hours as needed (for pain scale < 4). (Patient not taking: No sig reported), Disp: , Rfl:    coconut oil OIL, Apply 1 application topically as needed. (Patient not taking: No sig reported), Disp: , Rfl: 0   ibuprofen (ADVIL) 100 MG/5ML suspension, Take 30 mLs (600 mg total) by mouth every 6 (six) hours. (Patient not taking: No sig reported), Disp: 473 mL, Rfl: 0   labetalol (NORMODYNE) 100 MG tablet, Take 1 tablet (100 mg total) by mouth 2 (two) times daily. (Patient not taking: No sig reported), Disp: 60 tablet, Rfl: 2   Prenatal MV & Min w/FA-DHA (PRENATAL GUMMIES PO), Take by mouth. (Patient not taking: No sig reported), Disp: , Rfl:  No Known Allergies  Social History   Tobacco Use   Smoking status: Never   Smokeless tobacco: Never  Substance Use Topics   Alcohol use: Not Currently    Comment: socially     Family History  Problem Relation Age of Onset   Hyperlipidemia Father     Hypertension Father    Diabetes Paternal Grandfather        Review of Systems Constitutional: negative for weight loss Genitourinary:negative for abnormal menstrual periods and vaginal discharge   Objective:   BP 131/76   Pulse 83   Ht 5\' 6"  (1.676 m)   Wt 157 lb 4.8 oz (71.4 kg)   LMP 02/24/2021   Breastfeeding Yes   BMI 25.39 kg/m    General:   Alert and no distress  Skin:   no rash or abnormalities  Lungs:   clear to auscultation bilaterally  Heart:   regular rate and rhythm, S1, S2 normal, no murmur, click, rub or gallop             Left Upper Extremity:  Nexplanon insertion site is C, D, I.  Rod palpated, non tender    Lab Review Urine pregnancy test Labs reviewed yes Radiologic studies reviewed no  I have spent a total of 15 minutes of face-to-face time, excluding clinical staff time, reviewing notes and preparing to see patient, ordering tests and/or medications, and counseling the patient.   Assessment:    25 y.o., continuing Nexplanon, no contraindications.   Plan:  All questions answered. Contraception: Nexplanon. Discussed healthy lifestyle modifications. Follow up in 4 months.   Brock Bad, MD 03/19/2021 4:16 PM

## 2021-03-19 NOTE — Progress Notes (Signed)
Pt presents for Nexplanon insertion follow up.  Nexplanon inserted 03/05/21

## 2021-09-24 IMAGING — US US PELVIS COMPLETE WITH TRANSVAGINAL
1 series · 14 of 25 positions shown · non-contrast
Comparison: None

CLINICAL DATA: Pelvic pain for 2 weeks, LMP 01/13/2020

EXAM:
TRANSABDOMINAL AND TRANSVAGINAL ULTRASOUND OF PELVIS
TECHNIQUE: Both transabdominal and transvaginal ultrasound examinations of the
pelvis were performed. Transabdominal technique was performed for
global imaging of the pelvis including uterus, ovaries, adnexal
regions, and pelvic cul-de-sac. It was necessary to proceed with
endovaginal exam following the transabdominal exam to visualize the
endometrium and LEFT ovary.

[Series 1: us pelvis complete with transvaginal · 0.16mm/px · 14 of 80 slices shown]
[im 1/80]
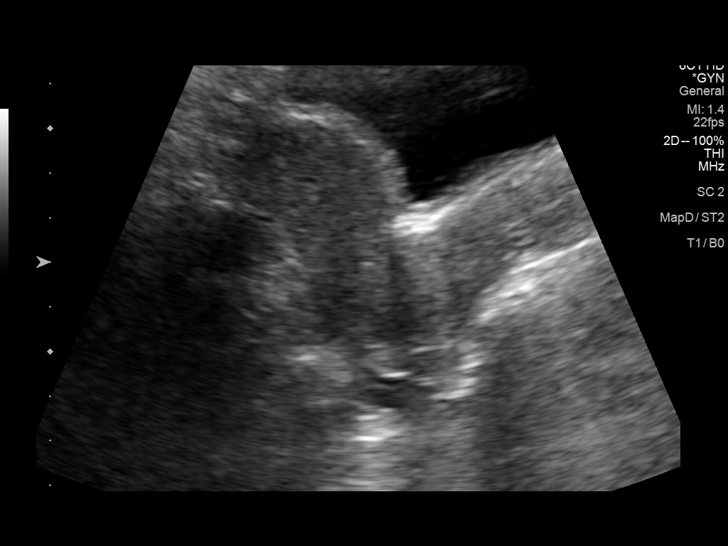
[im 7/80]
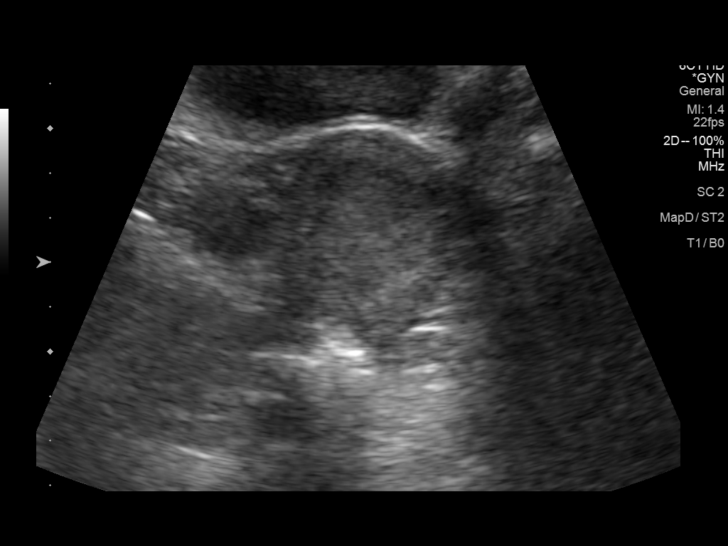
[im 14/80]
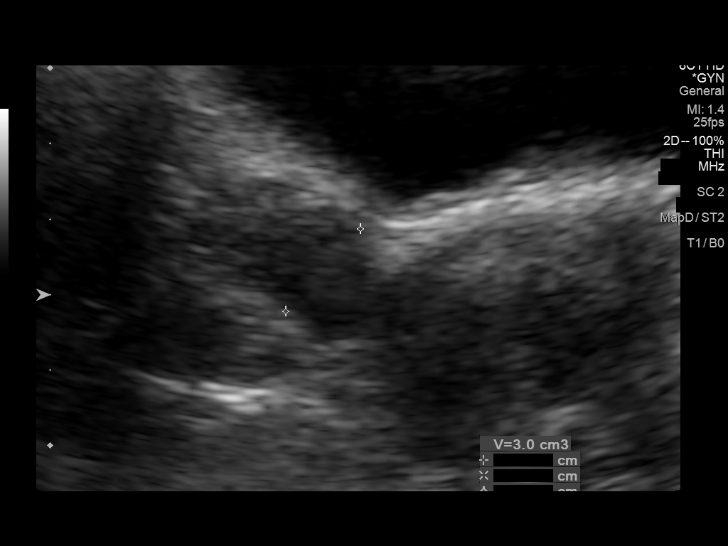
[im 20/80]
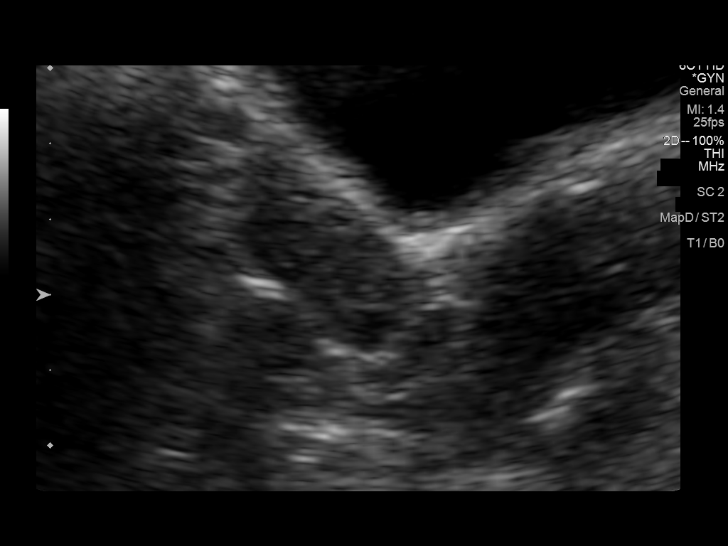
[im 27/80]
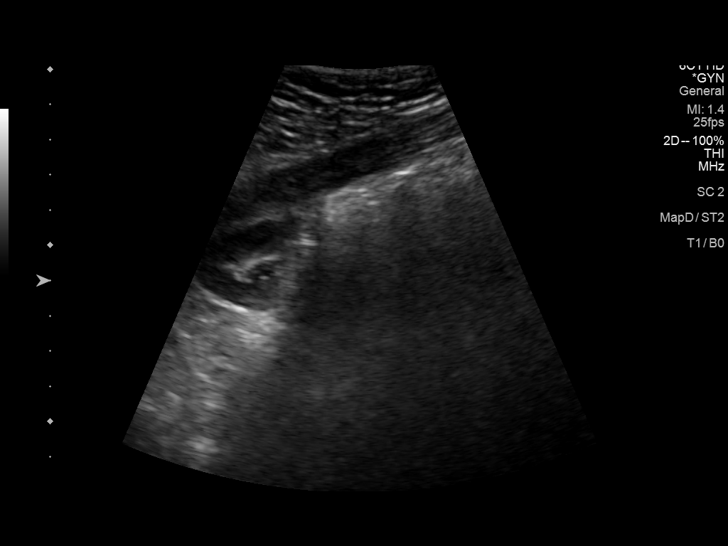
[im 30/80]
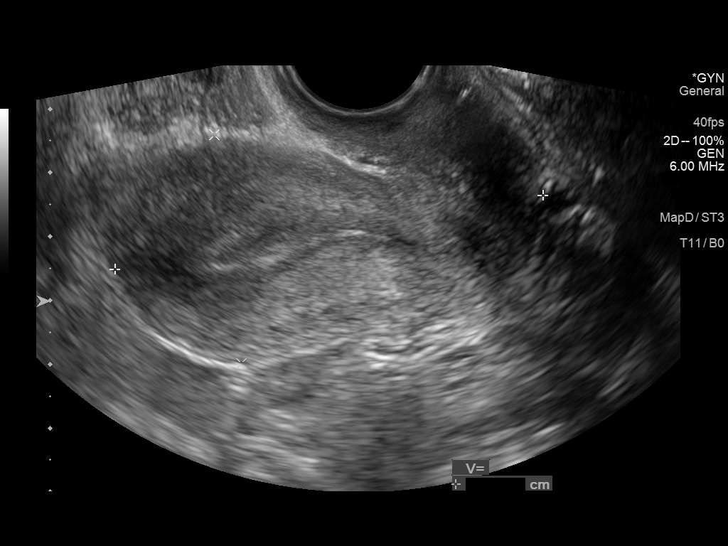
[im 37/80]
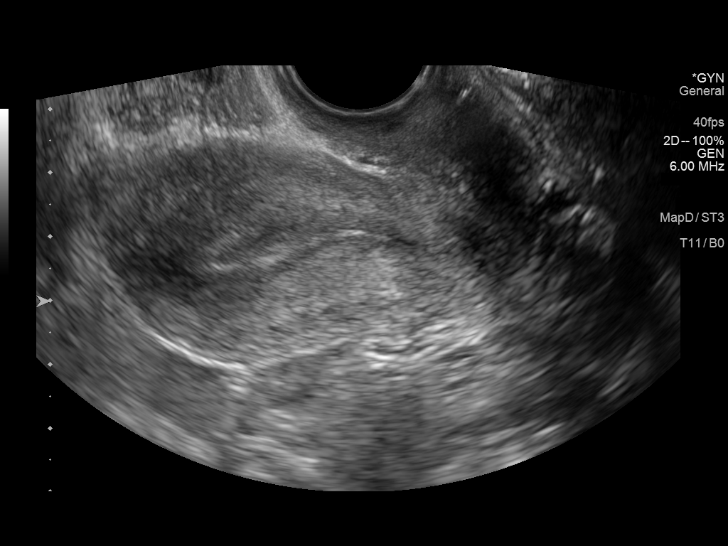
[im 43/80]
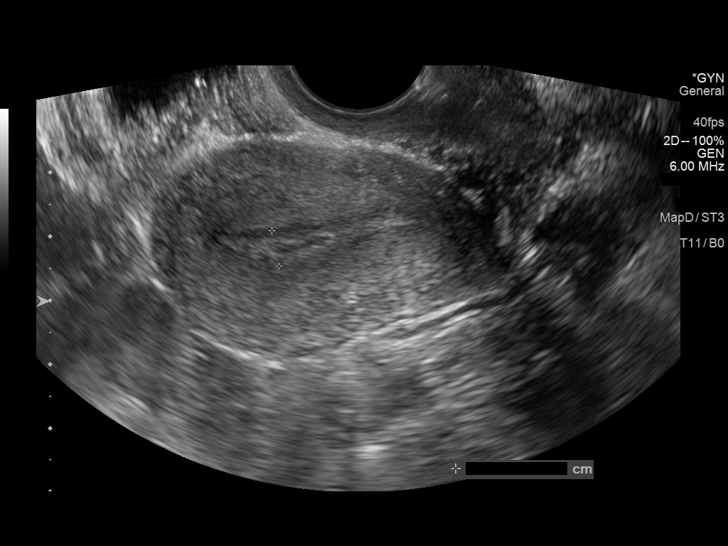
[im 50/80]
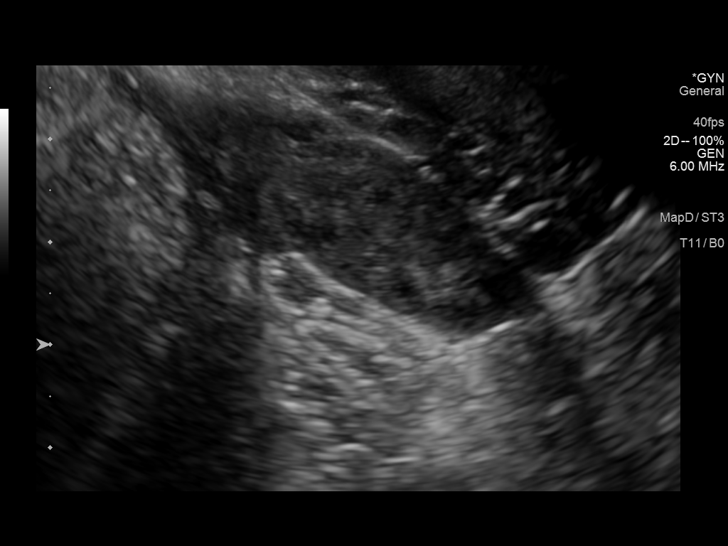
[im 53/80]
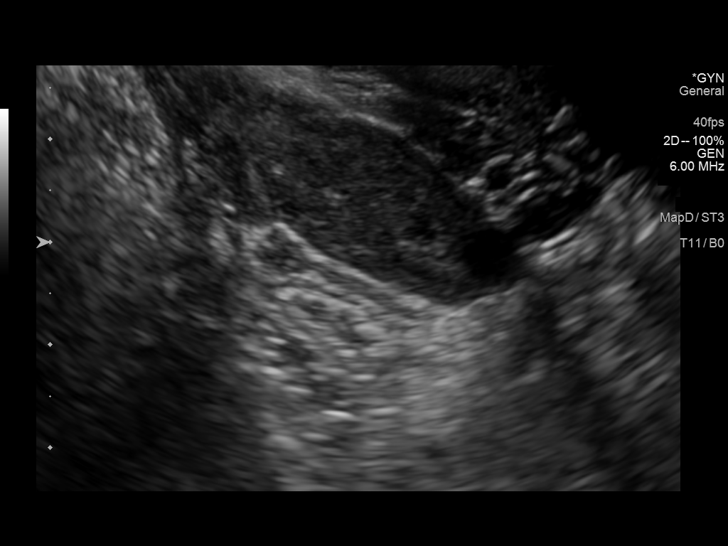
[im 60/80]
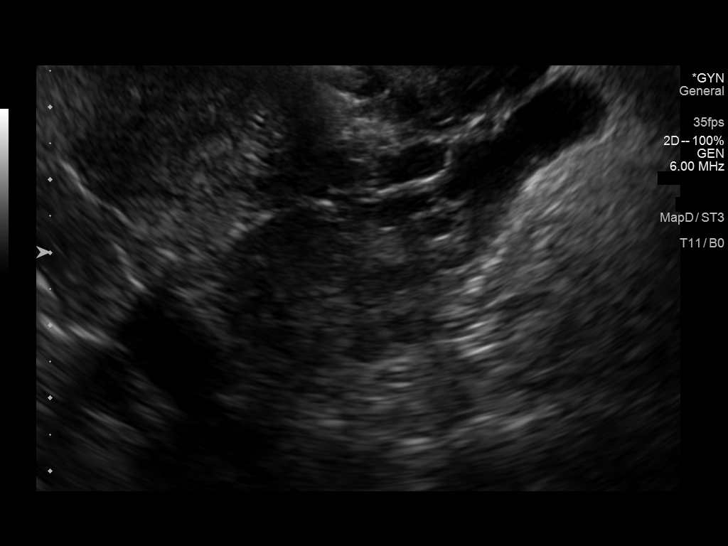
[im 66/80]
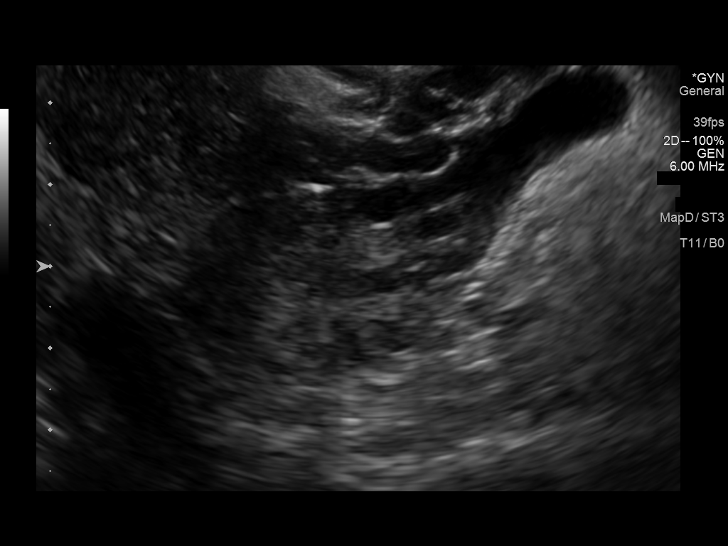
[im 73/80]
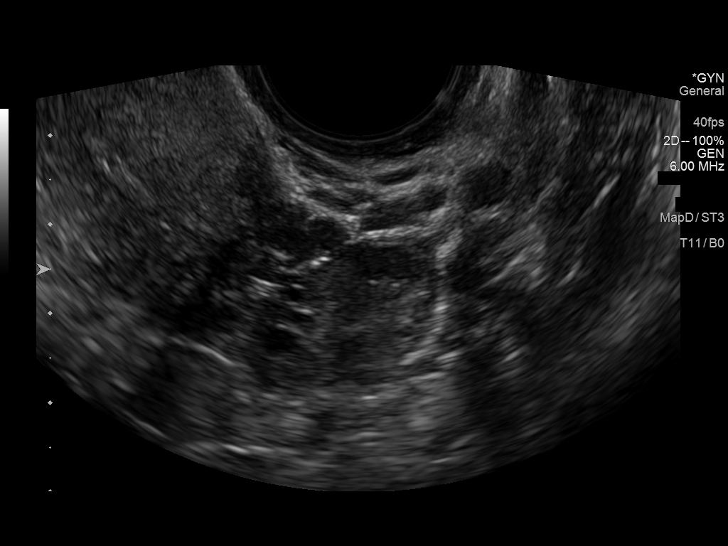
[im 80/80]
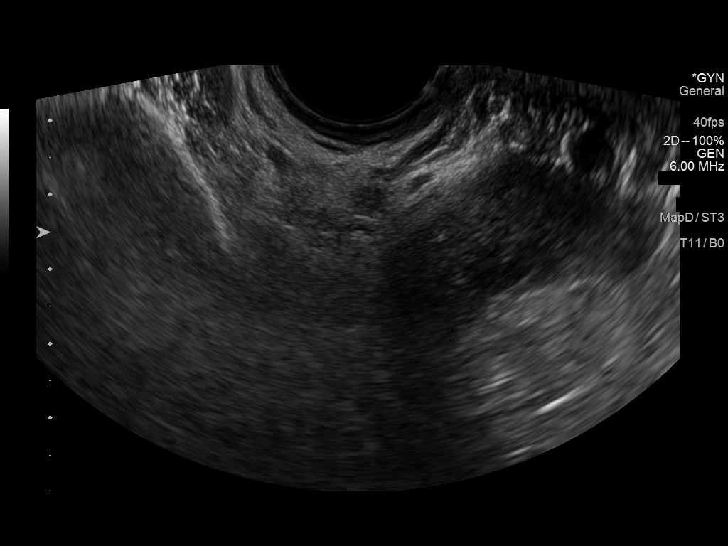

[14 of 25 positions shown; findings below may reference images not displayed]

FINDINGS: Uterus

Measurements: 6.8 x 3.6 x 4.1 cm = volume: 53 mL. Anteverted. Normal
morphology without mass

Endometrium

Thickness: 6 mm.  No endometrial fluid or focal mass

Right ovary

Measurements: 3.9 x 1.4 x 1.5 cm = volume: 4.2 mL. Normal morphology
without mass

Left ovary

Measurements: At 4.2 x 2.5 x 1.7 cm = volume: 9.1 mL. Normal
morphology without mass

Other findings

Trace free pelvic fluid, likely physiologic.  No adnexal masses.
IMPRESSION: Normal exam.

## 2021-12-29 ENCOUNTER — Ambulatory Visit
Admission: EM | Admit: 2021-12-29 | Discharge: 2021-12-29 | Disposition: A | Discharge status: Home / Self Care | Payer: 59 | Attending: Emergency Medicine | Admitting: Emergency Medicine

## 2021-12-29 DIAGNOSIS — H6121 Impacted cerumen, right ear: Secondary | ICD-10-CM

## 2021-12-29 DIAGNOSIS — H9202 Otalgia, left ear: Secondary | ICD-10-CM

## 2021-12-29 DIAGNOSIS — H6122 Impacted cerumen, left ear: Secondary | ICD-10-CM

## 2021-12-29 DIAGNOSIS — J302 Other seasonal allergic rhinitis: Secondary | ICD-10-CM

## 2021-12-29 MED ORDER — FLUTICASONE PROPIONATE 50 MCG/ACT NA SUSP: 1.0000 | Freq: Every day | NASAL | 1 refills | Status: DC

## 2021-12-29 NOTE — Discharge Instructions (Addendum)
Please read the enclosed handout regarding earwax buildup in adults to learn more about this condition and self-care at home.  Please feel free to visit [place] anytime that you are feeling that you have pressure in your ears. ? ?For your allergic rhinitis, I recommend that you begin using Flonase 2 sprays in each nare daily for 1 week then decrease to once daily.  I recommend that you continue using Flonase until the pollen has stopped falling, usually mid to late June. ? ?Thank you for visiting urgent care today.  I appreciate the opportunity to participate in your care. ?

## 2021-12-29 NOTE — ED Triage Notes (Signed)
Pt presents with left ear pain for about a week then developed cough 2 days ago  ?

## 2021-12-29 NOTE — ED Provider Notes (Signed)
?[facility] ? ? ? ?CSN: [id number] ?Arrival date & time: 12/29/21  [id number] ?  ? ?HISTORY  ? ?Chief Complaint  ?Patient presents with  ? Otalgia  ? Cough  ? ?HPI ?Cassie Church is a 26 y.o. female.  ?Otalgia ?Associated symptoms: cough   ?Cough ?Associated symptoms: ear pain   ?Past Medical History:  ?Diagnosis Date  ? Herpes simplex virus (HSV) infection   ? ?Patient Active Problem List  ? Diagnosis Date Noted  ? Preeclampsia in postpartum period 02/03/2021  ? Anemia in pregnancy 10/24/2020  ? Alpha thalassemia silent carrier 07/14/2020  ? ?Past Surgical History:  ?Procedure Laterality Date  ? WISDOM TOOTH EXTRACTION Bilateral 2014  ? ?OB History   ? ? Gravida  ?1  ? Para  ?1  ? Term  ?1  ? Preterm  ?0  ? AB  ?0  ? Living  ?1  ?  ? ? SAB  ?0  ? IAB  ?0  ? Ectopic  ?0  ? Multiple  ?0  ? Live Births  ?1  ?   ?  ?  ? ?Home Medications   ? ?Prior to Admission medications   ?Not on File  ? ?Family History ?Family History  ?Problem Relation Age of Onset  ? Hyperlipidemia Father   ? Hypertension Father   ? Diabetes Paternal Grandfather   ? ?Social History ?Social History  ? ?Tobacco Use  ? Smoking status: Never  ? Smokeless tobacco: Never  ?Vaping Use  ? Vaping Use: Never used  ?Substance Use Topics  ? Alcohol use: Not Currently  ?  Comment: socially   ? Drug use: No  ? ?Allergies   ?Patient has no known allergies. ? ?Review of Systems ?Review of Systems  ?HENT:  Positive for ear pain.   ?Respiratory:  Positive for cough.   ?Pertinent findings noted in history of present illness.  ? ?Physical Exam ?Triage Vital Signs ?ED Triage Vitals  ?Enc Vitals Group  ?   BP 07/10/21 0827 (!) 147/82  ?   Pulse Rate 07/10/21 0827 72  ?   Resp 07/10/21 0827 18  ?   Temp 07/10/21 0827 98.3 ?F (36.8 ?C)  ?   Temp Source 07/10/21 0827 Oral  ?   SpO2 07/10/21 0827 98 %  ?   Weight --   ?   Height --   ?   Head Circumference --   ?   Peak Flow --   ?   Pain Score 07/10/21 0826 5  ?   Pain Loc --   ?   Pain Edu? --   ?   Excl. in [place]?  --   ?No data found. ? ?Updated Vital Signs ?BP 120/84   Pulse 74   Temp 99.2 ?F (37.3 ?C)   Resp 18   LMP 11/24/2021 (Approximate)   SpO2 97%   Breastfeeding No  ? ?Physical Exam ?Vitals and nursing note reviewed.  ?Constitutional:   ?   General: She is not in acute distress. ?   Appearance: Normal appearance. She is not ill-appearing.  ?HENT:  ?   Head: Normocephalic and atraumatic.  ?   Salivary Glands: Right salivary gland is not diffusely enlarged or tender. Left salivary gland is not diffusely enlarged or tender.  ?   Right Ear: External ear normal. There is impacted cerumen.  ?   Left Ear: External ear normal. There is impacted cerumen.  ?   Nose: Rhinorrhea present. No nasal  deformity, septal deviation, signs of injury, nasal tenderness, mucosal edema or congestion. Rhinorrhea is clear.  ?   Right Nostril: Occlusion present. No foreign body, epistaxis or septal hematoma.  ?   Left Nostril: Occlusion present. No foreign body, epistaxis or septal hematoma.  ?   Right Turbinates: Enlarged, swollen and pale.  ?   Left Turbinates: Enlarged, swollen and pale.  ?   Right Sinus: No maxillary sinus tenderness or frontal sinus tenderness.  ?   Left Sinus: No maxillary sinus tenderness or frontal sinus tenderness.  ?   Mouth/Throat:  ?   Lips: Pink. No lesions.  ?   Mouth: Mucous membranes are moist. No oral lesions.  ?   Pharynx: Oropharynx is clear. Uvula midline. No posterior oropharyngeal erythema or uvula swelling.  ?   Tonsils: No tonsillar exudate. 0 on the right. 0 on the left.  ?   Comments: Postnasal drip ?Eyes:  ?   General: Lids are normal.     ?   Right eye: No discharge.     ?   Left eye: No discharge.  ?   Extraocular Movements: Extraocular movements intact.  ?   Conjunctiva/sclera: Conjunctivae normal.  ?   Right eye: Right conjunctiva is not injected.  ?   Left eye: Left conjunctiva is not injected.  ?Neck:  ?   Trachea: Trachea and phonation normal.  ?Cardiovascular:  ?   Rate and Rhythm: Normal  rate and regular rhythm.  ?   Pulses: Normal pulses.  ?   Heart sounds: Normal heart sounds. No murmur heard. ?  No friction rub. No gallop.  ?Pulmonary:  ?   Effort: Pulmonary effort is normal. No accessory muscle usage, prolonged expiration or respiratory distress.  ?   Breath sounds: Normal breath sounds. No stridor, decreased air movement or transmitted upper airway sounds. No decreased breath sounds, wheezing, rhonchi or rales.  ?Chest:  ?   Chest wall: No tenderness.  ?Musculoskeletal:     ?   General: Normal range of motion.  ?   Cervical back: Normal range of motion and neck supple. Normal range of motion.  ?Lymphadenopathy:  ?   Cervical: No cervical adenopathy.  ?Skin: ?   General: Skin is warm and dry.  ?   Findings: No erythema or rash.  ?Neurological:  ?   General: No focal deficit present.  ?   Mental Status: She is alert and oriented to person, place, and time.  ?Psychiatric:     ?   Mood and Affect: Mood normal.     ?   Behavior: Behavior normal.  ? ? ?Visual Acuity ?Right Eye Distance:   ?Left Eye Distance:   ?Bilateral Distance:   ? ?Right Eye Near:   ?Left Eye Near:    ?Bilateral Near:    ? ?UC Couse / Diagnostics / Procedures:  ?  ?EKG ? ?Radiology ?No results found. ? ?Procedures ?Ear Cerumen Removal ? ?Date/Time: 12/29/2021 12:30 PM ?Performed by: Azalia Bilis, RN ?Authorized by: Lynden Oxford Scales, PA-C  ? ?Consent:  ?  Consent obtained:  Verbal ?  Consent given by:  Patient ?  Risks, benefits, and alternatives were discussed: yes   ?  Risks discussed:  Bleeding, dizziness, infection, incomplete removal, pain and TM perforation ?  Alternatives discussed:  No treatment, delayed treatment, alternative treatment, observation and referral ?Universal protocol:  ?  Procedure explained and questions answered to patient or proxy's satisfaction: yes   ?  Patient identity confirmed:  Verbally with patient and arm band ?Procedure details:  ?  Location:  L ear and R ear ?  Procedure type: irrigation   ?   Procedure outcomes: cerumen removed   ?Post-procedure details:  ?  Inspection:  No bleeding, ear canal clear and TM intact ?  Hearing quality:  Normal ?  Procedure completion:  Tolerated (including critical care time) ? ?UC Diagnoses / Final Clinical Impressions(s)   ?I have reviewed the triage vital signs and the nursing notes. ? ?Pertinent labs & imaging results that were available during my care of the patient were reviewed by me and considered in my medical decision making (see chart for details).   ?Final diagnoses:  ?Impacted cerumen of left ear  ?Otalgia of left ear  ?Seasonal allergic rhinitis, unspecified trigger  ?Cerumen debris on tympanic membrane of right ear  ? ?Cerumen removed from both ears without difficulty.  Patient advised to begin Flonase for postnasal drip and cough.  Return precautions advised. ? ?ED Prescriptions   ? ? Medication Sig Dispense Auth. Provider  ? fluticasone (FLONASE) 50 MCG/ACT nasal spray Place 1 spray into both nostrils daily. Begin by using 2 sprays in each nare daily for 3 to 5 days, then decrease to 1 spray in each nare daily. 32 mL Lynden Oxford Scales, PA-C  ? ?  ? ?PDMP not reviewed this encounter. ? ?Pending results:  ?Labs Reviewed - No data to display ? ?Medications Ordered in UC: ?Medications - No data to display ? ?Disposition Upon Discharge:  ?Condition: stable for discharge home ?Home: take medications as prescribed; routine discharge instructions as discussed; follow up as advised. ? ?Patient presented with an acute illness with associated systemic symptoms and significant discomfort requiring urgent management. In my opinion, this is a condition that a prudent lay person (someone who possesses an average knowledge of health and medicine) may potentially expect to result in complications if not addressed urgently such as respiratory distress, impairment of bodily function or dysfunction of bodily organs.  ? ?Routine symptom specific, illness specific and/or  disease specific instructions were discussed with the patient and/or caregiver at length.  ? ?As such, the patient has been evaluated and assessed, work-up was performed and treatment was provided in alignme

## 2022-03-12 ENCOUNTER — Other Ambulatory Visit: Payer: Self-pay | Admitting: Gastroenterology

## 2022-03-12 DIAGNOSIS — R112 Nausea with vomiting, unspecified: Secondary | ICD-10-CM

## 2022-03-19 ENCOUNTER — Ambulatory Visit
Admission: RE | Admit: 2022-03-19 | Discharge: 2022-03-19 | Disposition: A | Discharge status: Home / Self Care | Payer: BLUE CROSS/BLUE SHIELD | Source: Ambulatory Visit | Attending: Gastroenterology | Admitting: Gastroenterology

## 2022-03-19 DIAGNOSIS — R112 Nausea with vomiting, unspecified: Secondary | ICD-10-CM

## 2022-09-24 ENCOUNTER — Encounter (HOSPITAL_COMMUNITY): Payer: Self-pay

## 2022-09-24 ENCOUNTER — Emergency Department (HOSPITAL_COMMUNITY)
Admission: EM | Admit: 2022-09-24 | Discharge: 2022-09-24 | Disposition: A | Discharge status: Home / Self Care | Payer: BLUE CROSS/BLUE SHIELD | Attending: Emergency Medicine | Admitting: Emergency Medicine

## 2022-09-24 ENCOUNTER — Other Ambulatory Visit: Payer: Self-pay

## 2022-09-24 DIAGNOSIS — M5442 Lumbago with sciatica, left side: Secondary | ICD-10-CM | POA: Insufficient documentation

## 2022-09-24 DIAGNOSIS — M5441 Lumbago with sciatica, right side: Secondary | ICD-10-CM | POA: Insufficient documentation

## 2022-09-24 DIAGNOSIS — M545 Low back pain, unspecified: Secondary | ICD-10-CM | POA: Diagnosis present

## 2022-09-24 MED ORDER — CYCLOBENZAPRINE HCL 10 MG PO TABS: 10.0000 mg | ORAL_TABLET | Freq: Three times a day (TID) | ORAL | 0 refills | Status: DC | PRN

## 2022-09-24 MED ORDER — METHOCARBAMOL 500 MG PO TABS
500.0000 mg | ORAL_TABLET | Freq: Once | ORAL | Status: AC
  Administered 2022-09-24: 500 mg via ORAL
  Filled 2022-09-24: qty 1

## 2022-09-24 MED ORDER — LIDOCAINE 5 % EX PTCH
1.0000 | MEDICATED_PATCH | CUTANEOUS | Status: DC
  Administered 2022-09-24: 1 via TRANSDERMAL
  Filled 2022-09-24: qty 1

## 2022-09-24 MED ORDER — KETOROLAC TROMETHAMINE 30 MG/ML IJ SOLN
30.0000 mg | Freq: Once | INTRAMUSCULAR | Status: AC
  Administered 2022-09-24: 30 mg via INTRAMUSCULAR
  Filled 2022-09-24: qty 1

## 2022-09-24 MED ORDER — OXYCODONE-ACETAMINOPHEN 5-325 MG PO TABS
2.0000 | ORAL_TABLET | Freq: Once | ORAL | Status: AC
  Administered 2022-09-24: 2 via ORAL
  Filled 2022-09-24: qty 2

## 2022-09-24 MED ORDER — PREDNISONE 20 MG PO TABS: 40.0000 mg | ORAL_TABLET | Freq: Every day | ORAL | 0 refills | Status: DC

## 2022-09-24 NOTE — ED Provider Notes (Signed)
[facility] Emergency Department Provider Note MRN:  [id number]  Arrival date & time: 09/24/22     Chief Complaint   Back Pain   History of Present Illness   Cassie Church is a 27 y.o. year-old female presents to the ED with chief complaint of low back pain.  She states that the pain radiates down both legs.  Onset was this morning after she twisted to use some toilet paper.  She denies numbness or weakness in her legs.  She states that while she was riding over to the hospital in the car she had tingling in her hands and in her face.  She states that at that time she was experiencing severe amount of pain.  She states that the symptoms have resolved.  She denies bowel or bladder incontinence.  She is ambulatory with antalgic gait.  She has tried taking ibuprofen without much relief.  History provided by patient.   Review of Systems  Pertinent positive and negative review of systems noted in HPI.    Physical Exam   Vitals:   09/24/22 0240  BP: (!) 141/83  Pulse: (!) 52  Resp: 18  Temp: 98.2 F (36.8 C)  SpO2: 96%    CONSTITUTIONAL:  non toxic-appearing, NAD NEURO:  Alert and oriented x 3, CN 3-12 grossly intact EYES:  eyes equal and reactive ENT/NECK:  Supple, no stridor  CARDIO:  appears well-perfused  PULM:  No respiratory distress,  GI/GU:  non-distended,  MSK/SPINE:  No gross deformities, no edema, moves all extremities  SKIN:  no rash, atraumatic   *Additional and/or pertinent findings included in MDM below  Diagnostic and Interventional Summary    EKG Interpretation  Date/Time:    Ventricular Rate:    PR Interval:    QRS Duration:   QT Interval:    QTC Calculation:   R Axis:     Text Interpretation:         Labs Reviewed - No data to display  No orders to display    Medications  lidocaine (LIDODERM) 5 % 1 patch (1 patch Transdermal Patch Applied 09/24/22 0313)  ketorolac (TORADOL) 30 MG/ML injection 30 mg (30 mg  Intramuscular Given 09/24/22 0313)  methocarbamol (ROBAXIN) tablet 500 mg (500 mg Oral Given 09/24/22 0313)  oxyCODONE-acetaminophen (PERCOCET/ROXICET) 5-325 MG per tablet 2 tablet (2 tablets Oral Given 09/24/22 9326)     Procedures  /  Critical Care Procedures  ED Course and Medical Decision Making  I have reviewed the triage vital signs, the nursing notes, and pertinent available records from the EMR.  Social Determinants Affecting Complexity of Care: Patient has no clinically significant social determinants affecting this chief complaint..   ED Course:    Medical Decision Making Patient here with low back pain since this morning.  Onset was after twisting to get some toilet paper.  She complains of bilateral radicular symptoms.  She denies bowel or bladder incontinence.  Denies numbness.  She is able to walk.  Doubt cauda equina.  Symptoms do seem consistent with lumbar radiculopathy.  Patient treated with IM Toradol, Percocet, Robaxin, and Lidoderm patch.  4:02 AM Patient states that she is feeling improved.  She would like to be discharged.  I will prescribe prednisone and Flexeril.  Recommend that she follow-up with her doctor or with orthopedics.  Risk Prescription drug management.     Consultants: No consultations were needed in caring for this patient.   Treatment and Plan: Emergency department workup does  not suggest an emergent condition requiring admission or immediate intervention beyond  what has been performed at this time. The patient is safe for discharge and has  been instructed to return immediately for worsening symptoms, change in  symptoms or any other concerns    Final Clinical Impressions(s) / ED Diagnoses     ICD-10-CM   1. Acute bilateral low back pain with bilateral sciatica  M54.42    M54.41       ED Discharge Orders          Ordered    cyclobenzaprine (FLEXERIL) 10 MG tablet  3 times daily PRN        09/24/22 0400    predniSONE (DELTASONE)  20 MG tablet  Daily        09/24/22 0400              Discharge Instructions Discussed with and Provided to Patient:   Discharge Instructions   None      Montine Circle, PA-C 09/24/22 0403    Jeanell Sparrow, DO 09/25/22 [phone]

## 2022-09-24 NOTE — ED Triage Notes (Signed)
Pt reports with lower back pain that radiates into both legs since 0900 Thur morning. Pt states that this has happened before just not this bad.

## 2023-02-22 ENCOUNTER — Ambulatory Visit
Admission: EM | Admit: 2023-02-22 | Discharge: 2023-02-22 | Disposition: A | Discharge status: Home / Self Care | Payer: BLUE CROSS/BLUE SHIELD | Attending: Nurse Practitioner | Admitting: Nurse Practitioner

## 2023-02-22 DIAGNOSIS — M5441 Lumbago with sciatica, right side: Secondary | ICD-10-CM | POA: Diagnosis not present

## 2023-02-22 DIAGNOSIS — M5442 Lumbago with sciatica, left side: Secondary | ICD-10-CM

## 2023-02-22 MED ORDER — CYCLOBENZAPRINE HCL 10 MG PO TABS: 10.0000 mg | ORAL_TABLET | Freq: Two times a day (BID) | ORAL | 0 refills | Status: DC | PRN

## 2023-02-22 MED ORDER — NAPROXEN 500 MG PO TABS: 500.0000 mg | ORAL_TABLET | Freq: Two times a day (BID) | ORAL | 0 refills | Status: AC

## 2023-02-22 MED ORDER — PREDNISONE 10 MG (21) PO TBPK: ORAL_TABLET | Freq: Every day | ORAL | 0 refills | Status: DC

## 2023-02-22 NOTE — ED Triage Notes (Signed)
Pt presents to UC w/ c/o lower back pain, more on the right since yesterday. Denies direct injury or falling. Ibuprofen and tylenol help pain.

## 2023-02-22 NOTE — ED Provider Notes (Signed)
[facility]    CSN: [id number] Arrival date & time: 02/22/23  1414      History   Chief Complaint No chief complaint on file.   HPI Cassie Church is a 27 y.o. female presents for evaluation of low back pain.  Patient reports a persistent waxing and waning right low back pain that does radiate down both of her legs.  Denies any known injury or inciting event.  Denies any numbness/tingling/weakness of her lower extremities, no bowel or bladder incontinence, no saddle paresthesia.  No history of back surgeries.  She does have remote history of low back pain in January that resolved with steroids.  She has been taking Tylenol and ibuprofen with some improvement in pain.  No other concerns at this time.  HPI  Past Medical History:  Diagnosis Date   Herpes simplex virus (HSV) infection     Patient Active Problem List   Diagnosis Date Noted   Preeclampsia in postpartum period 02/03/2021   Anemia in pregnancy 10/24/2020   Alpha thalassemia silent carrier 07/14/2020    Past Surgical History:  Procedure Laterality Date   WISDOM TOOTH EXTRACTION Bilateral 2014    OB History     Gravida  1   Para  1   Term  1   Preterm  0   AB  0   Living  1      SAB  0   IAB  0   Ectopic  0   Multiple  0   Live Births  1            Home Medications    Prior to Admission medications   Medication Sig Start Date End Date Taking? Authorizing Provider  cyclobenzaprine (FLEXERIL) 10 MG tablet Take 1 tablet (10 mg total) by mouth 2 (two) times daily as needed for muscle spasms. 02/22/23  Yes Radford Pax, NP  naproxen (NAPROSYN) 500 MG tablet Take 1 tablet (500 mg total) by mouth 2 (two) times daily for 7 days. 02/22/23 03/01/23 Yes Radford Pax, NP  predniSONE (STERAPRED UNI-PAK 21 TAB) 10 MG (21) TBPK tablet Take by mouth daily. Take 6 tabs by mouth daily  for 2 days, then 5 tabs for 2 days, then 4 tabs for 2 days, then 3 tabs for 2 days, 2 tabs for 2 days, then 1  tab by mouth daily for 2 days 02/22/23  Yes Radford Pax, NP  fluticasone (FLONASE) 50 MCG/ACT nasal spray Place 1 spray into both nostrils daily. Begin by using 2 sprays in each nare daily for 3 to 5 days, then decrease to 1 spray in each nare daily. 12/29/21   Theadora Rama Scales, PA-C    Family History Family History  Problem Relation Age of Onset   Hyperlipidemia Father    Hypertension Father    Diabetes Paternal Grandfather     Social History Social History   Tobacco Use   Smoking status: Never   Smokeless tobacco: Never  Vaping Use   Vaping Use: Never used  Substance Use Topics   Alcohol use: Not Currently    Comment: socially    Drug use: Yes    Types: Marijuana     Allergies   Patient has no known allergies.   Review of Systems Review of Systems  Musculoskeletal:  Positive for back pain.     Physical Exam Triage Vital Signs ED Triage Vitals  Enc Vitals Group     BP 02/22/23 1421 120/76  Pulse Rate 02/22/23 1421 78     Resp 02/22/23 1421 16     Temp 02/22/23 1421 97.8 F (36.6 C)     Temp Source 02/22/23 1421 Oral     SpO2 02/22/23 1421 99 %     Weight --      Height --      Head Circumference --      Peak Flow --      Pain Score 02/22/23 1424 6     Pain Loc --      Pain Edu? --      Excl. in GC? --    No data found.  Updated Vital Signs BP 120/76 (BP Location: Right Arm)   Pulse 78   Temp 97.8 F (36.6 C) (Oral)   Resp 16   LMP 01/27/2023 (Approximate)   SpO2 99%   Visual Acuity Right Eye Distance:   Left Eye Distance:   Bilateral Distance:    Right Eye Near:   Left Eye Near:    Bilateral Near:     Physical Exam Vitals and nursing note reviewed.  Constitutional:      General: She is not in acute distress.    Appearance: Normal appearance. She is not ill-appearing.  HENT:     Head: Normocephalic and atraumatic.  Eyes:     Pupils: Pupils are equal, round, and reactive to light.  Cardiovascular:     Rate and Rhythm:  Normal rate.  Pulmonary:     Effort: Pulmonary effort is normal.  Musculoskeletal:     Cervical back: Normal.     Thoracic back: Normal.     Lumbar back: Tenderness present. No swelling, signs of trauma, lacerations, spasms or bony tenderness. Normal range of motion. Positive right straight leg raise test and positive left straight leg raise test. No scoliosis.       Back:     Comments: Strength 5 out of 5 bilateral lower extremities  Skin:    General: Skin is warm and dry.  Neurological:     General: No focal deficit present.     Mental Status: She is alert and oriented to person, place, and time.  Psychiatric:        Mood and Affect: Mood normal.        Behavior: Behavior normal.      UC Treatments / Results  Labs (all labs ordered are listed, but only abnormal results are displayed) Labs Reviewed - No data to display  EKG   Radiology No results found.  Procedures Procedures (including critical care time)  Medications Ordered in UC Medications - No data to display  Initial Impression / Assessment and Plan / UC Course  I have reviewed the triage vital signs and the nursing notes.  Pertinent labs & imaging results that were available during my care of the patient were reviewed by me and considered in my medical decision making (see chart for details).     Reviewed exam and symptoms with patient.  No red flags.  Discussed low back strain Flexeril as needed.  Side effect profile reviewed Naproxen twice daily for 7 days Prednisone taper Heat and rest PCP follow-up if symptoms do not improve ER precautions reviewed and patient verbalized understanding Final Clinical Impressions(s) / UC Diagnoses   Final diagnoses:  Acute right-sided low back pain with bilateral sciatica     Discharge Instructions      Prednisone taper as prescribed Flexeril as needed.  Please of this medication make you drowsy.  Do not drink alcohol or drive on this medication Naproxen  twice daily for 7 days Heat to the low back and rest Follow-up with your PCP if your symptoms do not improve Please go to the emergency room for any worsening symptoms Hope you feel better soon!   ED Prescriptions     Medication Sig Dispense Auth. Provider   cyclobenzaprine (FLEXERIL) 10 MG tablet Take 1 tablet (10 mg total) by mouth 2 (two) times daily as needed for muscle spasms. 10 tablet Radford Pax, NP   naproxen (NAPROSYN) 500 MG tablet Take 1 tablet (500 mg total) by mouth 2 (two) times daily for 7 days. 14 tablet Radford Pax, NP   predniSONE (STERAPRED UNI-PAK 21 TAB) 10 MG (21) TBPK tablet Take by mouth daily. Take 6 tabs by mouth daily  for 2 days, then 5 tabs for 2 days, then 4 tabs for 2 days, then 3 tabs for 2 days, 2 tabs for 2 days, then 1 tab by mouth daily for 2 days 42 tablet Radford Pax, NP      PDMP not reviewed this encounter.   Radford Pax, NP 02/22/23 1447

## 2023-02-22 NOTE — Discharge Instructions (Addendum)
Prednisone taper as prescribed Flexeril as needed.  Please of this medication make you drowsy.  Do not drink alcohol or drive on this medication Naproxen twice daily for 7 days Heat to the low back and rest Follow-up with your PCP if your symptoms do not improve Please go to the emergency room for any worsening symptoms Hope you feel better soon!

## 2023-07-01 ENCOUNTER — Ambulatory Visit
Admission: EM | Admit: 2023-07-01 | Discharge: 2023-07-01 | Disposition: A | Discharge status: Home / Self Care | Payer: BLUE CROSS/BLUE SHIELD | Attending: Physician Assistant | Admitting: Physician Assistant

## 2023-07-01 DIAGNOSIS — R509 Fever, unspecified: Secondary | ICD-10-CM | POA: Insufficient documentation

## 2023-07-01 DIAGNOSIS — R051 Acute cough: Secondary | ICD-10-CM | POA: Insufficient documentation

## 2023-07-01 DIAGNOSIS — B349 Viral infection, unspecified: Secondary | ICD-10-CM | POA: Diagnosis present

## 2023-07-01 DIAGNOSIS — Z1152 Encounter for screening for COVID-19: Secondary | ICD-10-CM | POA: Insufficient documentation

## 2023-07-01 DIAGNOSIS — R112 Nausea with vomiting, unspecified: Secondary | ICD-10-CM | POA: Diagnosis not present

## 2023-07-01 LAB — POCT URINALYSIS DIP (MANUAL ENTRY)
Bilirubin, UA: NEGATIVE
Glucose, UA: NEGATIVE mg/dL
Ketones, POC UA: NEGATIVE mg/dL
Leukocytes, UA: NEGATIVE
Nitrite, UA: NEGATIVE
Protein Ur, POC: 30 mg/dL — AB
Spec Grav, UA: 1.02 (ref 1.010–1.025)
Urobilinogen, UA: 0.2 U/dL
pH, UA: 8.5 — AB (ref 5.0–8.0)

## 2023-07-01 LAB — POCT URINE PREGNANCY: Preg Test, Ur: NEGATIVE

## 2023-07-01 MED ORDER — IBUPROFEN 100 MG/5ML PO SUSP
600.0000 mg | Freq: Once | ORAL | Status: AC
  Administered 2023-07-01: 600 mg via ORAL

## 2023-07-01 MED ORDER — ACETAMINOPHEN 160 MG/5ML PO SOLN
650.0000 mg | Freq: Once | ORAL | Status: AC
  Administered 2023-07-01: 650 mg via ORAL

## 2023-07-01 MED ORDER — ONDANSETRON 4 MG PO TBDP: 4.0000 mg | ORAL_TABLET | Freq: Three times a day (TID) | ORAL | 0 refills | Status: DC | PRN

## 2023-07-01 MED ORDER — PROMETHAZINE-DM 6.25-15 MG/5ML PO SYRP: 5.0000 mL | ORAL_SOLUTION | Freq: Four times a day (QID) | ORAL | 0 refills | Status: DC | PRN

## 2023-07-01 MED ORDER — ONDANSETRON 4 MG PO TBDP
4.0000 mg | ORAL_TABLET | Freq: Once | ORAL | Status: AC
  Administered 2023-07-01: 4 mg via ORAL

## 2023-07-01 NOTE — ED Provider Notes (Signed)
[facility]    CSN: [id number] Arrival date & time: 07/01/23  1011      History   Chief Complaint No chief complaint on file.   HPI Cassie Church is a 27 y.o. female.   Patient presents today with a 5-day history of URI symptoms.  Reports initially she had some congestion and sore throat but this has progressed and she now has a cough, nausea, vomiting.  Reports multiple episodes of nausea and vomiting described as stomach contents yesterday.  She did take a dose of Zofran which provided some relief of symptoms.  She has also tried ibuprofen and TheraFlu.  She does report that her 58-year-old son has been sick but he has since improved.  She does not know he was ultimately diagnosed with.  She has had COVID with last episode in January 2022.  Denies any significant past medical history including allergies, asthma, COPD.  She occasionally smokes marijuana but denies regular tobacco use.  She denies any recent antibiotics or steroids.  She has been able to drink water today without recurrent vomiting.  Denies any associated diarrhea, chest pain, fever, shortness of breath.    Past Medical History:  Diagnosis Date   Herpes simplex virus (HSV) infection     Patient Active Problem List   Diagnosis Date Noted   Preeclampsia in postpartum period 02/03/2021   Anemia in pregnancy 10/24/2020   Alpha thalassemia silent carrier 07/14/2020    Past Surgical History:  Procedure Laterality Date   WISDOM TOOTH EXTRACTION Bilateral 2014    OB History     Gravida  1   Para  1   Term  1   Preterm  0   AB  0   Living  1      SAB  0   IAB  0   Ectopic  0   Multiple  0   Live Births  1            Home Medications    Prior to Admission medications   Medication Sig Start Date End Date Taking? Authorizing Provider  ondansetron (ZOFRAN-ODT) 4 MG disintegrating tablet Take 1 tablet (4 mg total) by mouth every 8 (eight) hours as needed for nausea or vomiting.  07/01/23  Yes Mercedees Convery K, PA-C  promethazine-dextromethorphan (PROMETHAZINE-DM) 6.25-15 MG/5ML syrup Take 5 mLs by mouth 4 (four) times daily as needed for cough. 07/01/23  Yes Keval Nam K, PA-C  fluticasone (FLONASE) 50 MCG/ACT nasal spray Place 1 spray into both nostrils daily. Begin by using 2 sprays in each nare daily for 3 to 5 days, then decrease to 1 spray in each nare daily. 12/29/21   Theadora Rama Scales, PA-C    Family History Family History  Problem Relation Age of Onset   Hyperlipidemia Father    Hypertension Father    Diabetes Paternal Grandfather     Social History Social History   Tobacco Use   Smoking status: Never   Smokeless tobacco: Never  Vaping Use   Vaping status: Never Used  Substance Use Topics   Alcohol use: Not Currently    Comment: socially    Drug use: Yes    Types: Marijuana     Allergies   Patient has no known allergies.   Review of Systems Review of Systems  Constitutional:  Positive for activity change and fatigue. Negative for appetite change and fever.  HENT:  Positive for congestion and sore throat. Negative for sinus pressure and sneezing.  Respiratory:  Positive for cough. Negative for shortness of breath.   Cardiovascular:  Negative for chest pain.  Gastrointestinal:  Positive for nausea and vomiting. Negative for abdominal pain and diarrhea.  Musculoskeletal:  Positive for arthralgias and myalgias.  Neurological:  Positive for headaches. Negative for dizziness and light-headedness.     Physical Exam Triage Vital Signs ED Triage Vitals  Encounter Vitals Group     BP 07/01/23 1022 118/71     Systolic BP Percentile --      Diastolic BP Percentile --      Pulse Rate 07/01/23 1022 (!) 109     Resp 07/01/23 1022 16     Temp 07/01/23 1022 99.1 F (37.3 C)     Temp Source 07/01/23 1022 Oral     SpO2 07/01/23 1022 98 %     Weight --      Height --      Head Circumference --      Peak Flow --      Pain Score 07/01/23  1021 0     Pain Loc --      Pain Education --      Exclude from Growth Chart --    No data found.  Updated Vital Signs BP 118/71 (BP Location: Left Arm)   Pulse (!) 102   Temp 99.7 F (37.6 C) (Oral)   Resp 16   SpO2 98%   Visual Acuity Right Eye Distance:   Left Eye Distance:   Bilateral Distance:    Right Eye Near:   Left Eye Near:    Bilateral Near:     Physical Exam Vitals reviewed.  Constitutional:      General: She is awake. She is not in acute distress.    Appearance: Normal appearance. She is well-developed. She is not ill-appearing.     Comments: Very pleasant female appears stated age in no acute distress sitting comfortably in exam room  HENT:     Head: Normocephalic and atraumatic.     Right Ear: Tympanic membrane, ear canal and external ear normal. Tympanic membrane is not erythematous or bulging.     Left Ear: Tympanic membrane, ear canal and external ear normal. There is impacted cerumen. Tympanic membrane is not erythematous or bulging.     Ears:     Comments: Left ear: Cerumen impaction noted.  Able to visualize approximately 30% of TM that appears normal.    Nose:     Right Sinus: No maxillary sinus tenderness or frontal sinus tenderness.     Left Sinus: No maxillary sinus tenderness or frontal sinus tenderness.     Mouth/Throat:     Pharynx: Uvula midline. No oropharyngeal exudate or posterior oropharyngeal erythema.  Cardiovascular:     Rate and Rhythm: Regular rhythm. Tachycardia present.     Heart sounds: Normal heart sounds, S1 normal and S2 normal. No murmur heard. Pulmonary:     Effort: Pulmonary effort is normal.     Breath sounds: Normal breath sounds. No wheezing, rhonchi or rales.     Comments: Clear to auscultation bilaterally Abdominal:     General: Bowel sounds are normal.     Palpations: Abdomen is soft.     Tenderness: There is generalized abdominal tenderness. There is no right CVA tenderness, left CVA tenderness, guarding or  rebound. Negative signs include Murphy's sign, Rovsing's sign, McBurney's sign and psoas sign.     Comments: Mild tenderness to palpation throughout abdomen.  No evidence of acute abdomen on physical  exam.  Psychiatric:        Behavior: Behavior is cooperative.      UC Treatments / Results  Labs (all labs ordered are listed, but only abnormal results are displayed) Labs Reviewed  POCT URINALYSIS DIP (MANUAL ENTRY) - Abnormal; Notable for the following components:      Result Value   Blood, UA moderate (*)    pH, UA 8.5 (*)    Protein Ur, POC =30 (*)    All other components within normal limits  SARS CORONAVIRUS 2 (TAT 6-24 HRS)  POCT URINE PREGNANCY    EKG   Radiology No results found.  Procedures Procedures (including critical care time)  Medications Ordered in UC Medications  ondansetron (ZOFRAN-ODT) disintegrating tablet 4 mg (4 mg Oral Given 07/01/23 1041)  ibuprofen (ADVIL) 100 MG/5ML suspension 600 mg (600 mg Oral Given 07/01/23 1108)  acetaminophen (TYLENOL) 160 MG/5ML solution 650 mg (650 mg Oral Given 07/01/23 1132)    Initial Impression / Assessment and Plan / UC Course  I have reviewed the triage vital signs and the nursing notes.  Pertinent labs & imaging results that were available during my care of the patient were reviewed by me and considered in my medical decision making (see chart for details).     Patient is tachycardic but otherwise well-appearing, nontoxic.  She did develop a low-grade fever during her visit but was given Tylenol and ibuprofen with improvement of tachycardia and temperature.  She was given Zofran in clinic and able to pass oral challenge by eating pretzels and drinking water without recurrent emesis.  Urine pregnancy was negative.  UA was obtained that showed blood (patient reports she is on her menstrual cycle) but no evidence of dehydration or infection.  Patient was interested in COVID testing so this was obtained.  She is young  and otherwise healthy so not a candidate for antiviral therapy.  Will treat symptomatically and Promethazine DM was sent to help manage cough symptoms.  We discussed that this can be sedating and she is not to drive or drink alcohol taking it.  Zofran was also sent to pharmacy with instruction to use this on a scheduled basis for the next several days and then decrease use to as needed thereafter.  She is to eat a bland diet and drink plenty of fluid.  Recommended that she use over-the-counter medications including Mucinex, Flonase, Tylenol, ibuprofen, nasal saline/sinus rinses.  Discussed that if her symptoms not improving within a week she is to return for reevaluation.  If she has any worsening symptoms she needs to be seen immediately.  Strict return precautions given.  Work excuse note provided.  Final Clinical Impressions(s) / UC Diagnoses   Final diagnoses:  Viral illness  Acute cough  Nausea and vomiting, unspecified vomiting type  Fever, unspecified     Discharge Instructions      As we know, I am concerned that you have a viral illness.  We will contact you if you are positive for COVID.  Use Zofran every 8 hours for the next several days.  Eat a bland diet and drink plenty of fluid.  Take Promethazine DM for cough.  This will make you sleepy so do not drive or drink alcohol taking it.  You can use over-the-counter medications including Mucinex, Flonase, Tylenol, ibuprofen, nasal saline/sinus rinses.  If your symptoms are not improving within a week please return for reevaluation.  If anything worsens and you have worsening cough, nausea/vomiting despite medication, fever despite  medicine, weakness, chest pain, shortness of breath you need to be seen immediately.     ED Prescriptions     Medication Sig Dispense Auth. Provider   ondansetron (ZOFRAN-ODT) 4 MG disintegrating tablet Take 1 tablet (4 mg total) by mouth every 8 (eight) hours as needed for nausea or vomiting. 20 tablet  Catrena Vari K, PA-C   promethazine-dextromethorphan (PROMETHAZINE-DM) 6.25-15 MG/5ML syrup Take 5 mLs by mouth 4 (four) times daily as needed for cough. 118 mL Jessup Ogas K, PA-C      PDMP not reviewed this encounter.   Jeani Hawking, PA-C 07/01/23 1151

## 2023-07-01 NOTE — ED Triage Notes (Signed)
Pt presents to UC w/ c/o sore throat starting 5 days ago. Pt reports a congested cough and vomiting starting yesterday Pt has tried theraflu

## 2023-07-01 NOTE — Discharge Instructions (Addendum)
As we know, I am concerned that you have a viral illness.  We will contact you if you are positive for COVID.  Use Zofran every 8 hours for the next several days.  Eat a bland diet and drink plenty of fluid.  Take Promethazine DM for cough.  This will make you sleepy so do not drive or drink alcohol taking it.  You can use over-the-counter medications including Mucinex, Flonase, Tylenol, ibuprofen, nasal saline/sinus rinses.  If your symptoms are not improving within a week please return for reevaluation.  If anything worsens and you have worsening cough, nausea/vomiting despite medication, fever despite medicine, weakness, chest pain, shortness of breath you need to be seen immediately.

## 2023-07-02 LAB — SARS CORONAVIRUS 2 (TAT 6-24 HRS): SARS Coronavirus 2: NEGATIVE

## 2023-07-04 NOTE — Plan of Care (Signed)
[facility] Tonsillectomy/Adenoidectomy, Postoperative PEDS care plan entered in error.

## 2024-05-15 ENCOUNTER — Other Ambulatory Visit (HOSPITAL_COMMUNITY): Payer: Self-pay

## 2024-05-29 ENCOUNTER — Encounter (HOSPITAL_COMMUNITY): Payer: Self-pay

## 2024-05-29 ENCOUNTER — Other Ambulatory Visit: Payer: Self-pay

## 2024-05-29 ENCOUNTER — Emergency Department (HOSPITAL_COMMUNITY)
Admission: EM | Admit: 2024-05-29 | Discharge: 2024-05-29 | Disposition: A | Discharge status: Home / Self Care | Attending: Emergency Medicine | Admitting: Emergency Medicine

## 2024-05-29 DIAGNOSIS — Z859 Personal history of malignant neoplasm, unspecified: Secondary | ICD-10-CM | POA: Diagnosis not present

## 2024-05-29 DIAGNOSIS — M545 Low back pain, unspecified: Secondary | ICD-10-CM | POA: Diagnosis present

## 2024-05-29 LAB — PREGNANCY, URINE: Preg Test, Ur: NEGATIVE

## 2024-05-29 MED ORDER — OXYCODONE-ACETAMINOPHEN 5-325 MG PO TABS: 1.0000 | ORAL_TABLET | Freq: Four times a day (QID) | ORAL | 0 refills | Status: AC | PRN

## 2024-05-29 MED ORDER — METHOCARBAMOL 500 MG PO TABS: 500.0000 mg | ORAL_TABLET | Freq: Two times a day (BID) | ORAL | 0 refills | Status: AC

## 2024-05-29 MED ORDER — OXYCODONE-ACETAMINOPHEN 5-325 MG PO TABS
1.0000 | ORAL_TABLET | ORAL | Status: DC | PRN
  Administered 2024-05-29: 1 via ORAL
  Filled 2024-05-29: qty 1

## 2024-05-29 MED ORDER — METHOCARBAMOL 500 MG PO TABS: 500.0000 mg | ORAL_TABLET | Freq: Two times a day (BID) | ORAL | 0 refills | Status: DC

## 2024-05-29 MED ORDER — OXYCODONE-ACETAMINOPHEN 5-325 MG PO TABS: 1.0000 | ORAL_TABLET | Freq: Four times a day (QID) | ORAL | 0 refills | Status: DC | PRN

## 2024-05-29 NOTE — ED Triage Notes (Signed)
 Pt c/o tailbone pain x 3 weeks. Pt saw MD and prescribed anti-inflammatory and steroid taper. Pt states pain has worsened this am. Pt states this is a recurrent issue over past year and a half and turning and feeling a pop. Pt denies numbness, has some tingling with shooting pains in buttocks. Pt denies bowel/bladder incontinence.

## 2024-05-29 NOTE — ED Provider Notes (Signed)
 [place] [facility] Provider Note   CSN: [id number] Arrival date & time: 05/29/24  0745     Patient presents with: Back Pain   Cassie Church is a 28 y.o. female with overall noncontributory past medical history presents with concern for tailbone, low back pain for 3 weeks.  She reports that she has not had any fall, injury.  She reports similar pain around 1 year ago, did have some improvement with steroid, anti-inflammatories.  She reports the pain came back, worsened this morning after 4 hours twisting.  She denies any new fall, injury.  She denies any groin numbness, tingling.  She denies any leg numbness, weakness.  She reports she can ambulate without difficulty.  She is taking Celebrex, she was recently prescribed a steroid but she has not started taking it yet.  She is following with an orthopedic physician.  She reports she has had an x-ray of the affected area which did not show any fracture or other damage.    Back Pain      Prior to Admission medications   Medication Sig Start Date End Date Taking? Authorizing Provider  celecoxib (CELEBREX) 200 MG capsule Take 200 mg by mouth daily. 05/23/24  Yes [provider]  methocarbamol  (ROBAXIN ) 500 MG tablet Take 1 tablet (500 mg total) by mouth 2 (two) times daily. 05/29/24  Yes Ly Bacchi H, PA-C  oxyCODONE -acetaminophen  (PERCOCET/ROXICET) 5-325 MG tablet Take 1 tablet by mouth every 6 (six) hours as needed for severe pain (pain score 7-10). 05/29/24  Yes Georgianna Band H, PA-C    Allergies: Patient has no known allergies.    Review of Systems  Musculoskeletal:  Positive for back pain.  All other systems reviewed and are negative.   Updated Vital Signs BP (!) 132/92   Pulse 74   Temp 98.7 F (37.1 C)   Resp 18   Ht 5' 6 (1.676 m)   Wt 77.1 kg   LMP 04/28/2024 (Approximate)   SpO2 99%   BMI 27.44 kg/m   Physical Exam Vitals and nursing note reviewed.   Constitutional:      General: She is not in acute distress.    Appearance: Normal appearance.  HENT:     Head: Normocephalic and atraumatic.  Eyes:     General:        Right eye: No discharge.        Left eye: No discharge.  Cardiovascular:     Rate and Rhythm: Normal rate and regular rhythm.     Heart sounds: No murmur heard.    No friction rub. No gallop.  Pulmonary:     Effort: Pulmonary effort is normal.     Breath sounds: Normal breath sounds.  Abdominal:     General: Bowel sounds are normal.     Palpations: Abdomen is soft.  Musculoskeletal:     Comments: Intact strength 5/5 of bilateral lower extremities, normal sensation throughout. Can walk without difficulty.  Tenderness to palpation midline low back, coccyx.  Some radiation to the lumbar paraspinous muscles.  No obvious step-off, deformity, or soft tissue swelling.  Skin:    General: Skin is warm and dry.     Capillary Refill: Capillary refill takes less than 2 seconds.  Neurological:     Mental Status: She is alert and oriented to person, place, and time.  Psychiatric:        Mood and Affect: Mood normal.  Behavior: Behavior normal.     (all labs ordered are listed, but only abnormal results are displayed) Labs Reviewed  PREGNANCY, URINE    EKG: None  Radiology: No results found.   Procedures   Medications Ordered in the ED  oxyCODONE -acetaminophen  (PERCOCET/ROXICET) 5-325 MG per tablet 1 tablet (1 tablet Oral Given 05/29/24 0801)                                    Medical Decision Making Amount and/or Complexity of Data Reviewed Labs: ordered.  Risk Prescription drug management.   Patient with back pain.  My emergent differential diagnosis includes slipped disc, compression fracture, spondylolisthesis, less clinical concern for epidural abscess or osteomyelitis based on patient history.  Overall with high clinical suspicion for lumbar sprain, sciatica based on clinical presentation,  risk factors.  No neurological deficits. Patient is ambulatory. No warning symptoms of back pain including: fecal incontinence, urinary retention or overflow incontinence, night sweats, waking from sleep with back pain, unexplained fevers or weight loss, h/o cancer, IVDU, recent trauma. Overall low clinical concern for cauda equina, epidural abscess, or other serious cause of back pain.  Given this work-up, evaluation, physical exam I do not believe that radiographic imaging is indicated at this time.  Conservative measures such as rest, ice/heat, ibuprofen , Tylenol , and  prescription for Robaxin  , short course of percocet indicated with orthopedic follow-up if no improvement with conservative management.  Encourage patient to continue taking her steroid that she was just prescribed, and Celebrex.  Extensive return precautions given, patient discharged in stable condition at this time.  Final diagnoses:  Acute bilateral low back pain without sciatica    ED Discharge Orders          Ordered    methocarbamol  (ROBAXIN ) 500 MG tablet  2 times daily        05/29/24 0948    oxyCODONE -acetaminophen  (PERCOCET/ROXICET) 5-325 MG tablet  Every 6 hours PRN        05/29/24 0948               Andrienne Havener, Sherlean DEL, PA-C 05/29/24 9046    Doretha Folks, MD 05/29/24 1327

## 2024-05-29 NOTE — Discharge Instructions (Signed)
 Please use Tylenol  or ibuprofen  for pain.  You may use 600 mg ibuprofen  every 6 hours or 1000 mg of Tylenol  every 6 hours.  You may choose to alternate between the 2.  This would be most effective.  Not to exceed 4 g of Tylenol  within 24 hours.  Not to exceed 3200 mg ibuprofen  24 hours.  (Your Celebrex would be a substitution for ibuprofen )  You can use the muscle relaxant I am prescribing in addition to the above to help with any breakthrough pain.  You can take it up to twice daily.  It is safe to take at night, but I would be cautious taking it during the day as it can cause some drowsiness.  Make sure that you are feeling awake and alert before you get behind the wheel of a car or operate a motor vehicle.  It is not a narcotic pain medication so you are able to take it if it is not making you drowsy and still pilot a vehicle or machinery safely.  You can use the stronger narcotic pain medication in place of Tylenol  for severe break through pain.  If you take the narcotic pain medication that we prescribed recommend that you also take a laxative such as MiraLAX or Dulcolax every day that you take the narcotic pain medicine, and drink plenty of fluids, 50 to 64 ounces to prevent any constipation.
